# Patient Record
Sex: Female | Born: 1955 | Race: White | Hispanic: No | Marital: Married | Smoking: Never smoker
Health system: Southern US, Community
[De-identification: ages and names within clinical notes are randomized; demographics above are authoritative.]

## PROBLEM LIST (undated history)

## (undated) DIAGNOSIS — K219 Gastro-esophageal reflux disease without esophagitis: Secondary | ICD-10-CM

## (undated) DIAGNOSIS — K222 Esophageal obstruction: Secondary | ICD-10-CM

## (undated) DIAGNOSIS — J45909 Unspecified asthma, uncomplicated: Secondary | ICD-10-CM

## (undated) HISTORY — PX: ABDOMINAL HYSTERECTOMY: SHX81

## (undated) HISTORY — PX: ESOPHAGEAL DILATION: SHX303

---

## 1998-07-31 ENCOUNTER — Ambulatory Visit (HOSPITAL_COMMUNITY): Admission: RE | Admit: 1998-07-31 | Discharge: 1998-07-31 | Payer: Self-pay | Admitting: Obstetrics and Gynecology

## 1998-12-12 ENCOUNTER — Encounter: Payer: Self-pay | Admitting: Gastroenterology

## 1998-12-12 ENCOUNTER — Encounter (INDEPENDENT_AMBULATORY_CARE_PROVIDER_SITE_OTHER): Payer: Self-pay | Admitting: Specialist

## 1998-12-12 ENCOUNTER — Ambulatory Visit (HOSPITAL_COMMUNITY): Admission: RE | Admit: 1998-12-12 | Discharge: 1998-12-12 | Payer: Self-pay | Admitting: Gastroenterology

## 2002-09-15 ENCOUNTER — Other Ambulatory Visit: Admission: RE | Admit: 2002-09-15 | Discharge: 2002-09-15 | Payer: Self-pay | Admitting: Obstetrics and Gynecology

## 2003-03-23 ENCOUNTER — Encounter: Admission: RE | Admit: 2003-03-23 | Discharge: 2003-03-23 | Payer: Self-pay | Admitting: Obstetrics and Gynecology

## 2003-06-06 ENCOUNTER — Encounter (INDEPENDENT_AMBULATORY_CARE_PROVIDER_SITE_OTHER): Payer: Self-pay | Admitting: Specialist

## 2003-06-07 ENCOUNTER — Inpatient Hospital Stay (HOSPITAL_COMMUNITY): Admission: RE | Admit: 2003-06-07 | Discharge: 2003-06-08 | Payer: Self-pay | Admitting: Obstetrics and Gynecology

## 2003-11-01 ENCOUNTER — Other Ambulatory Visit: Admission: RE | Admit: 2003-11-01 | Discharge: 2003-11-01 | Payer: Self-pay | Admitting: Obstetrics and Gynecology

## 2004-03-04 ENCOUNTER — Emergency Department (HOSPITAL_COMMUNITY): Admission: EM | Admit: 2004-03-04 | Discharge: 2004-03-05 | Payer: Self-pay | Admitting: Emergency Medicine

## 2006-02-23 ENCOUNTER — Emergency Department (HOSPITAL_COMMUNITY): Admission: EM | Admit: 2006-02-23 | Discharge: 2006-02-24 | Payer: Self-pay | Admitting: Emergency Medicine

## 2007-02-26 ENCOUNTER — Encounter: Payer: Self-pay | Admitting: Internal Medicine

## 2007-02-26 ENCOUNTER — Ambulatory Visit (HOSPITAL_COMMUNITY): Admission: RE | Admit: 2007-02-26 | Discharge: 2007-02-26 | Payer: Self-pay | Admitting: Family Medicine

## 2007-05-06 HISTORY — PX: REPLACEMENT TOTAL KNEE: SUR1224

## 2007-05-26 ENCOUNTER — Inpatient Hospital Stay (HOSPITAL_COMMUNITY): Admission: RE | Admit: 2007-05-26 | Discharge: 2007-05-30 | Payer: Self-pay | Admitting: Orthopedic Surgery

## 2009-05-08 ENCOUNTER — Ambulatory Visit: Payer: Self-pay | Admitting: Internal Medicine

## 2009-05-08 DIAGNOSIS — J45909 Unspecified asthma, uncomplicated: Secondary | ICD-10-CM | POA: Insufficient documentation

## 2010-05-25 ENCOUNTER — Encounter: Payer: Self-pay | Admitting: Obstetrics and Gynecology

## 2010-05-25 ENCOUNTER — Encounter: Payer: Self-pay | Admitting: Interventional Radiology

## 2010-06-06 NOTE — Assessment & Plan Note (Signed)
Summary: ALLERGIES/KLW   Primary Provider/Referring Provider:  [name]  CC:  Allergy consult-Dr. Morrell Riddle.Marland Kitchen  History of Present Illness: May 08, 2009- 55yo F seen on kind referral by Mary Lennox PA-C at [facility], concerned about wheezing dyspnea. She has never smoked, and denies significant repiratoryproblem before a pneumonia when she was hosp at [facility] 3 years ago. She never full regained her premorbid breathing status after that. PFT 02/26/07- showed reduced FEV1/FVC with normal ratiio 0.80 and marked improvement after dilator. Since then she has had episodic shortness of breath with wheeze and soreness through sternum to back. These episodes respond to albuterol and prednisone and she si well in between. Trriggers include cigarette smoke, strong smells, some perfumes, leaf raking, change of seasons. She says she has few obvious colds. Now using Symbicort with intention to change to Qvar, but she is concerned that her insurance will only cover generics. She is breathing easily at night, crediting symbicort. Prilosec minimizes GERD symptoms, but she is pending upper endoscopy with Dr Mary Daniels. She hasn't noted seasonal rhinitis, itching or sneezing. Eyes do water in Spring.  Preventive Screening-Counseling & Management  Alcohol-Tobacco     Smoking Status: never  Current Medications (verified): 1)  Symbicort 160-4.5 Mcg/act Aero (Budesonide-Formoterol Fumarate) .... 2 Puffs Two Times A Day 2)  Proair Hfa 108 (90 Base) Mcg/act Aers (Albuterol Sulfate) .... 2 Puffs Four Times A Day As Needed 3)  Zyrtec Hives Relief 10 Mg Tabs (Cetirizine Hcl) .... Take 1 By Mouth Once Daily 4)  Womens Multivitamin Plus  Tabs (Multiple Vitamins-Minerals) .... Take 1 By Mouth Once Daily 5)  Prilosec Otc 20 Mg Tbec (Omeprazole Magnesium) .... 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  ETOH-never No Street Drug Use.   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  ETOH-never No Street Drug Use. Smoking Status:  never  Review of Systems      See HPI       The patient complains of shortness of breath with activity, shortness of breath at rest, chest pain, acid heartburn, indigestion, difficulty swallowing, sore throat, nasal congestion/difficulty breathing through nose, sneezing, itching, ear ache, and change in color of mucus.  The patient denies productive cough, non-productive cough, coughing up blood, irregular heartbeats, loss of appetite, weight change, abdominal pain, tooth/dental problems, headaches, anxiety, depression, hand/feet swelling, joint stiffness or pain, rash, and fever.    Vital Signs:  Patient profile:   55 year old female Height:      63 inches Weight:      246.50 pounds BMI:     43.82 O2 Sat:      98 % on Room air Pulse rate:   94 / minute BP sitting:   124 / 80  (left arm) Cuff size:   large  Vitals Entered By: Reynaldo Minium CMA (May 08, 2009 1:36 PM)  O2 Flow:  Room air  Physical Exam  Additional Exam:  General: A/Ox3; pleasant and cooperative, NAD, overweight SKIN: no rash, lesions NODES: no lymphadenopathy HEENT: [place]/AT, EOM- WNL, Conjuctivae- clear, PERRLA, TM-WNL, Nose- clear, Throat- clear  and wnl NECK: Supple w/ fair ROM, JVD- none, normal carotid impulses w/o bruits Thyroid- normal to palpation CHEST: trace expiratory squeeks, no wheeze or rhonchi,  unlabored HEART: RRR, no m/g/r heard ABDOMEN: Soft and nl; nml bowel sounds; ZOX:WRUE, nl pulses, no edema  NEURO: Grossly intact to observation      Impression & Recommendations:  Problem # 1:  ASTHMA (ICD-493.90) Adult onset asthma. We will get updated PFT to compare with 2008 from [facility], and get CXR. As she finishes the Symbicort she will transition to Qvar as planned. We can consider later whether more specific allergy evaluation would be useful.  Medications Added to Medication List This Visit: 1)  Symbicort 160-4.5 Mcg/act Aero (Budesonide-formoterol fumarate) .... 2 puffs two times a day 2)  Proair Hfa 108 (90 Base) Mcg/act Aers (Albuterol sulfate) .... 2 puffs four times a day as needed 3)  Zyrtec Hives Relief 10 Mg Tabs (Cetirizine hcl) .... Take 1 by mouth once daily 4)  Womens Multivitamin Plus Tabs (Multiple vitamins-minerals) .... Take 1 by mouth once daily 5)  Prilosec Otc 20 Mg Tbec (Omeprazole magnesium) .... Take 1 by mouth once daily  Other Orders: Consultation Level IV (99244) T-2 View CXR (71020TC)  Patient Instructions: 1)  Please schedule a follow-up appointment in 1 month. 2)  Schedule PFT 3)  A chest x-ray has been recommended.  Your imaging study may require preauthorization.

## 2010-09-17 NOTE — H&P (Signed)
NAMEINELL, MIMBS NO.:  [id number]   MEDICAL RECORD NO.:  [id number]          PATIENT TYPE:  INP   LOCATION:  NA                           FACILITY:  [facility]   PHYSICIAN:  Ollen Gross, M.D.    DATE OF BIRTH:  12/28/1955   DATE OF ADMISSION:  DATE OF DISCHARGE:                              HISTORY & PHYSICAL   Date of office visit and history and physical was performed on May 13, 2007.   CHIEF COMPLAINT:  Left knee pain.   HISTORY OF PRESENT ILLNESS:  The patient is a 55 year old female who has  seen by Dr. Lequita Halt for bilateral knee pain.  The left is more  symptomatic and problematic than the right.  It has been ongoing for  quite some time now.  It has gotten worse over the past several years.  She is seen in the office, where she has essentially bone on bone in the  medial compartment.  It has progressively gotten worse over time.  It  has been refractory to nonoperative management and now felt to be a good  candidate.  She has been seen preoperatively by Dr. Holley Bouche and  felt to be cleared for surgery.   ALLERGIES:  No known drug allergies.   CURRENT MEDICATIONS:  One A Day Vitamin for Women, Prilosec, Aleve,  calcium, glucosamine/chondroitin with MSN.   PAST MEDICAL HISTORY:  1. Past history of bronchitis.  2. Past history of pneumonia.  3. COPD.  4. Hiatal hernia.  5. Reflux disease.  6. History of colonic polyps.   PAST SURGICAL HISTORY:  1. Hysterectomy.  2. EGD with esophageal dilatation approximately 5 or 6 years ago.  3. Colonoscopy with polypectomy (she has had a total of three EGD      procedures).   FAMILY HISTORY:  Noncontributory.   SOCIAL HISTORY:  Married, nonsmoker.  No alcohol.  Three children.  Family will be assisting with care after surgery.   REVIEW OF SYSTEMS:  GENERAL:  No fevers, chills, night sweats.  NEUROLOGIC:  No seizure, syncope or paralysis.  RESPIRATORY:  No  shortness of breath, productive cough  or hemoptysis.  CARDIOVASCULAR:  No chest pain, angina or orthopnea.  GI:  No nausea, vomiting, diarrhea  or constipation.  GU:  No dysuria, dysuria or discharge.  MUSCULOSKELETAL:  Joint pain and swelling, morning stiffness with the  left knee.   PHYSICAL EXAM:  VITAL SIGNS:  Pulse 76, respirations 14, blood pressure  138/78.  GENERAL:  A 55 year old white female, well-nourished, well-developed,  overweight, in no acute distress, accompanied by her husband.  HEENT:  Normocephalic, atraumatic.  Pupils equal, round and reactive.  EOMs intact.  NECK:  Supple.  CHEST:  Clear, anterior posterior chest walls.  HEART:  Regular rate and rhythm without murmur, S1-S2 noted.  ABDOMEN:  Soft, nontender.  Bowel sounds present.  RECTAL, BREASTS, GENITALIA:  Not done, not pertinent to present illness.  EXTREMITIES:  Left knee:  Range of motion 5-110, tender more medial than  lateral.  No instability.   IMPRESSION:  1. Osteoarthritis, left knee.  2.  Past history of bronchitis.  3. Past history of pneumonia.  4. Chronic obstructive pulmonary disease.  5. Hiatal hernia.  6. Reflux disease.  7. History of colon polyps, status post polypectomy.   PLAN:  The patient admitted to [facility] to undergo a left  total knee replacement arthroplasty.  Surgery will be performed by Dr.  Ollen Gross.      Alexzandrew L. Perkins, P.A.C.      Ollen Gross, M.D.  Electronically Signed    ALP/MEDQ  D:  05/25/2007  T:  05/26/2007  Job:  [id number]   cc:   Holley Bouche, M.D.  Fax: [phone]   Bernette Redbird, M.D.  Fax: [phone]

## 2010-09-17 NOTE — Op Note (Signed)
Mary Daniels, Mary Daniels              ACCOUNT NO.:  [id number]   MEDICAL RECORD NO.:  [id number]          PATIENT TYPE:  INP   LOCATION:  1620                         FACILITY:  [facility]   PHYSICIAN:  Ollen Gross, M.D.    DATE OF BIRTH:  April 22, 1956   DATE OF PROCEDURE:  05/26/2007  DATE OF DISCHARGE:                               OPERATIVE REPORT   PREOPERATIVE DIAGNOSIS:  Osteoarthritis, left knee.   POSTOPERATIVE DIAGNOSIS:  Osteoarthritis, left knee.   PROCEDURE:  Left total knee arthroplasty.   SURGEON:  Ollen Gross, M.D.   ASSISTANT:  Alexzandrew L. Perkins, P.A.C.   ANESTHESIA:  Attempted spinal then general.   ESTIMATED BLOOD LOSS:  About 200 mL.   DRAINS:  None.   TOURNIQUET TIME:  No tourniquet used.   COMPLICATIONS:  None.   CONDITION:  Stable to recovery.   BRIEF CLINICAL NOTE:  Mary Daniels is a 55 year old female with end stage  arthritis of the left knee with progressively worsening pain and  dysfunction.  She has failed nonoperative management and presents for  total knee arthroplasty.   PROCEDURE IN DETAIL:  After the attempted administration of a spinal  anesthetic, a tourniquet was placed high on the left thigh and the left  lower extremity prepped and draped in the usual sterile fashion.  The  extremity was wrapped in Esmarch, knee flexed, and tourniquet inflated  300 mmHg.  A midline incision was made and it was obvious that the  patient was not tolerating this.  She was then converted over to general  anesthetic.  The incision is deepened to the extensor mechanism and a  fresh blade then used to make a medial parapatellar arthrotomy.  Soft  tissue over the proximal medial tibia is subperiosteally elevated to the  joint line with the knife into the semimembranosus bursa with a Cobb  elevator.  At this point, it was evident that this is a venous  tourniquet and it is not functioning well.  We subsequently released the  tourniquet and did the procedure  without tourniquet.  The soft tissue  over the proximal lateral tibia is elevated with attention being paid to  avoid patellar tendon on tibial tubercle.  The patella was subluxed  laterally, knee flexed 90 degrees, ACL and PCL removed.  A drill was  used to create a starting hole in the distal femur and the canal was  thoroughly irrigated.  The 5 degrees left valgus alignment guide was  placed and referencing off the posterior condyles, the rotation is  marked and the block pinned to remove 11 mm off the distal femur.  I  took 11 because of a preop flexion contracture.  Distal femoral  resection is made with an oscillating saw.  Size 3 is the most  appropriate femoral component and rotation is marked off the epicondylar  axis.  Size 3 cutting block is placed and the anterior, posterior, and  chamfer cuts made.   The tibia is subluxed forward and the menisci are removed.  Extramedullary tibial alignment guide is placed referencing proximally  on the medial aspect of the tibial  tubercle and distally on the second  metatarsal axis and tibial crest.  The block is pinned to remove  approximately 10 mm off of the non-deficient lateral side.  Tibial  resection is made with an oscillating saw.  Size 3 is the most  appropriate tibial component and the proximal tibia is prepared with the  modular drill and keel punch for a size 3.  Femoral preparation is  completed with the intercondylar cut.   A size 3 mobile bearing tibial trial, size 3 posterior stabilized  femoral trial, and a 10 mm posterior stabilized rotating platform insert  trial are placed.  With a 10, she has got a little bit of AP laxity in  extension and flexion.  We went to a 12.5 which allowed for full  extension with excellent varus, valgus, anterior, and posterior balance  throughout full range of motion.  The patella was then everted and  thickness measured to be about 23 mm.  Freehand resection is taken to 14  mm, 35 template  is placed, lug holes were drilled, trial patella was  placed and it tracks normally.  Osteophytes are removed off the  posterior femur with the trial in place.  All trials are removed and the  cut bone surfaces prepared with pulsatile lavage.  The cement was mixed  and once ready for implantation, a size 3 mobile bearing tibial tray,  size 3 posterior stabilized femur, and 35 patella are cemented into  place.  The patella was held with the clamp.  The trial 12.5 insert was  placed, knee held in full extension, all extruded cement removed.  When  the cement was fully hardened, we copiously irrigated the joint with  saline solution.  The trials were removed and FloSeal injected on the  posterior capsule.  The permanent 12.5 mm posterior stabilized rotating  platform insert is placed into the tibial tray.  The remainder of the  FloSeal is injected in the medial and lateral gutters and suprapatellar  area.  A moist sponge is placed.  We held the sponge for two minutes and  then removed it.  Any identified bleeding is stopped with  electrocautery.  We then thoroughly irrigated the joint and closed the  extensor mechanism with interrupted #1 PDS.  Flexion against gravity was  about 130 degrees.  The subcu was closed with interrupted 2-0 Vicryl and  subcuticular running 4-0 Monocryl.  The incision was cleaned and dried  and Steri-Strips and a bulky sterile dressing applied.  She is placed  into a knee immobilizer, awakened, and transferred to recovery in stable  condition.      Ollen Gross, M.D.  Electronically Signed     FA/MEDQ  D:  05/26/2007  T:  05/26/2007  Job:  [id number]

## 2010-09-20 NOTE — Op Note (Signed)
NAME:  Mary Daniels, Mary Daniels                        ACCOUNT NO.:  [id number]   MEDICAL RECORD NO.:  [id number]                   PATIENT TYPE:  OBV   LOCATION:  9311                                 FACILITY:  [facility]   PHYSICIAN:  Juluis Mire, M.D.                DATE OF BIRTH:  16-Jun-1955   DATE OF PROCEDURE:  06/06/2003  DATE OF DISCHARGE:                                 OPERATIVE REPORT   PREOPERATIVE DIAGNOSIS:  Uterine fibroid.   POSTOPERATIVE DIAGNOSIS:  Uterine fibroid.   OPERATION:  Laparoscopically assisted vaginal hysterectomy.   SURGEON:  Juluis Mire, M.D.   ASSISTANTFreddy Finner, M.D.   ANESTHESIA:  General endotracheal anesthesia.   ESTIMATED BLOOD LOSS:  400 mL.   PACKS AND DRAINS:  None.   INTRAOPERATIVE BLOOD PLACED:  None.   COMPLICATIONS:  None.   INDICATIONS FOR PROCEDURE:  As dictated in history and physical.   DESCRIPTION OF PROCEDURE:  The patient was taken to the OR and placed in the  supine position.  After satisfactory level of general endotracheal  anesthesia obtained, the patient was placed in the dorsal lithotomy position  using the Allen stirrups.  The abdomen, perineum and vagina were prepped out  with Betadine.  Bladder was emptied by in and out catheterization.  A Hulka  tenaculum was put in place.  The patient was draped as sterile field.  Subumbilical incision made with a knife.  The Veress needle was introduced  in the abdominal cavity.  Abdomen was inflated to approximately 3 liters of  carbon dioxide.  Due to the patient's obesity, we had trouble inserting the  trocar.  We kept ending up preperitoneal.  Instead of going to a longer  trocar, we decided to go to open procedure.  The fascia was identified and  entered sharply.  Incision in the fascia extended laterally.  The peritoneum  was entered.  Open laparoscopic trocar was put in place and secured.  Laparoscope was then introduced.  There was no evidence of injury to  adjacent  organs.  A 5 mm trocar was put in place in the suprapubic area  under direct visualization.  Visualization revealed the uterus to have a  large fibroid on the left fundus along the lower right uterine segment.  Tubes and ovaries were unremarkable.  She did have some implants of  endometriosis on the bladder flap.  Appendix was retrocecal, unremarkable.  Upper abdomen including the liver, tip of the gallbladder were clear.  Using  the Gyrus bipolar, first the right utero-ovarian pedicle was cauterized and  incised, right tube and mesosalpinx were cauterized and incised, right round  ligament was cauterized and incised.  At this point in time, the left utero-  ovarian pedicle was cauterized and incised, left tube and mesosalpinx were  cauterized and incised, and left round ligament was cauterized and incised.  We had good separation and hemostasis.  Abdomen was deflated of its carbon dioxide.  Laparoscope was removed.  The  patient's legs were repositioned.  Hulka tenaculum was then removed.  A  weighted speculum was placed in the vaginal vault.  Cervix was grasped with  Jacob's tenaculum.  Cul-de-sac was entered sharply.  Both uterosacral  ligaments were clamped, cut, and suture ligated with 0 Vicryl.  The  reflection of the vaginal mucosa anterior was incised and bladder was  dissected superiorly.  Using the clamp, cut and tie technique with suture  ligatures of 0 Vicryl, peritoneum was serially separated from the sides of  the uterus.  The small fibroid on the right side was inhibiting  visualization.  Therefore, it was removed.  At this point in time, we had  trouble entering the vesicouterine space.  The uterus was clipped.  We  identified the vesicouterine space and entered it sharply at this point in  time.  The remaining pedicles were clamped and cut and the uterus was passed  off of the operative field.  Held pedicle secure with free tie of 0 Vicryl.  Areas of oozing brought under  control with figure-of-eight of 0 Vicryl.  At  this point in time, vaginal mucosa was reapproximated in the midline with  interrupted sutures of 0 Vicryl.  Sponge on sponge stick was placed in the  vaginal vault.  Foley was placed to straight drain with retrieval of an  adequate amount of clear urine.   The patient's legs were repositioned.  Weighted speculum had been removed.  Laparoscope was reintroduced.  Visualization revealed some minimal oozing  from the vaginal cuff, brought under control with the Gyrus.  We thoroughly  irrigated the pelvis.  We had good hemostasis with no evidence of injury to  adjacent organs and clear urine output.  The abdomen was desufflated of its  carbon dioxide.  All trocars removed.  Subumbilical fascia closed with  figure-of-eight of 0 Vicryl, skin with interrupted subcuticulars of 4-0  Vicryl, suprapubic incision was closed with Dermabond.  Sponge on sponge  stick was removed.  The patient was taken out of the dorsal lithotomy  position.  Once extubated and alert, transferred to the recovery room in  good condition.  Sponge, needle and instrument counts were reported as  correct by the circulating nurse x3.                                               Juluis Mire, M.D.    JSM/MEDQ  D:  06/06/2003  T:  06/06/2003  Job:  [id number]

## 2010-09-20 NOTE — Discharge Summary (Signed)
Mary Daniels, Mary Daniels              ACCOUNT NO.:  [id number]   MEDICAL RECORD NO.:  [id number]          PATIENT TYPE:  INP   LOCATION:  1620                         FACILITY:  [facility]   PHYSICIAN:  Ollen Gross, M.D.    DATE OF BIRTH:  [birth year]   DATE OF ADMISSION:  05/26/2007  DATE OF DISCHARGE:  05/30/2007                               DISCHARGE SUMMARY   ADMITTING DIAGNOSES:  1. Osteoarthritis, left knee.  2. Past history of bronchitis.  3. Past history of pneumonia.  4. Chronic obstructive pulmonary disease.  5. Hiatal hernia.  6. Reflux disease.  7. History of colon polyps, status post polypectomy.   DISCHARGE DIAGNOSES:  1. Osteoarthritis, left knee, status post left total knee      arthroplasty.  2. Mild postoperative blood loss anemia.  3. Past history of pneumonia.  4. Chronic obstructive pulmonary disease.  5. Hiatal hernia.  6. Reflux disease.  7. History of colon polyps, status post polypectomy.   PROCEDURE:  May 26, 2007:  Left total knee surgery.   SURGEON:  Ollen Gross, M.D.   ASSISTANT:  Alexzandrew L. Perkins, P.A.C.   ANESTHESIA:  Attempted spinal, then conversion to general.   CONSULTS:  None.   BRIEF HISTORY:  Mary Daniels is a 55 year old female with end-stage  osteoarthritis of the left knee, progressive worsening pain,  dysfunction, failed non-operative interventions and who now presents for  total knee arthroplasty.   LABORATORY DATA:  Preop CBC showed a hemoglobin of 14.2, hematocrit of  40.5, white cell count 9.8.  Postop hemoglobin 10.7, drifting down to  9.9.  H&H 9.3 and 26.3.  PT and PTT on admission 12.3, and 27  respectively.  INR 0.9; serial protimes were followed with last PT/INR  22.9 and 2.  Chem panel on admission all within normal limits.  Serial  BMETs were followed and electrolytes remained within normal limits.  C02  went up from 28 to 34.  Preop UA:  Small leukocyte esterase, a few  epithelials, 0-2 white cells, a few  bacteria.  Blood type 0 negative.   EKG dated May 24, 2007 normal sinus rhythm, normal EKG, unconfirmed.   X-RAYS:  Chest x-ray May 24, 2007 mild bronchiectatic changes.   HOSPITAL COURSE:  The patient was admitted to [facility] and  tolerated procedure well.  She was later transferred to the recovery  room and orthopedic floor.  She had a fair amount of itching on the  evening of surgery and on the morning of day #1 felt to be due to the  Duramorph.  Used Benadryl and p.r.n. meds.  She had a history of  sciatica and her back was a little sore on the morning of day 1.  We  encouraged her to get up out of bed with therapy.  Home meds were  resumed.  Hemoglobin was stable at 10.7.  By day 2, still was a little  painful.  Her sciatica had been acting up.  Encouraged p.o. meds.  Discontinued the PCA.  She actually did get up, went well with therapy,  walked about 200 feet.  Dressing change.  Incision looked good.  No  signs of infection.  She was kept an extra day due to the pain.  Stayed  2 more days but by day 4, she was doing well.  Continued to work with  therapy.  Pain was under better control, and by May 30, 2007 she was  discharged to home.   DISCHARGE PLAN:  The patient was discharged to home on May 30, 2007.   See above for discharge diagnoses.   DISCHARGE MEDICATIONS:  Percocet, Robaxin, Coumadin.   ACTIVITY:  Weightbearing as tolerated.  Total knee protocol.   DIET:  As tolerated.   FOLLOWUP:  In 2 weeks.   DISPOSITION:  Home.   CONDITION ON DISCHARGE:  Improving.      Alexzandrew L. Perkins, P.A.C.      Ollen Gross, M.D.  Electronically Signed    ALP/MEDQ  D:  07/13/2007  T:  07/13/2007  Job:  [id number]   cc:   Ollen Gross, M.D.  Fax: [phone]   Bernette Redbird, M.D.  Fax: [phone]   Holley Bouche, M.D.  Fax: [phone]

## 2010-09-20 NOTE — Discharge Summary (Signed)
NAME:  Mary Daniels, Mary Daniels                        ACCOUNT NO.:  [id number]   MEDICAL RECORD NO.:  [id number]                   PATIENT TYPE:  INP   LOCATION:  9311                                 FACILITY:  [facility]   PHYSICIAN:  Juluis Mire, M.D.                DATE OF BIRTH:  June 09, 1955   DATE OF ADMISSION:  06/06/2003  DATE OF DISCHARGE:                                 DISCHARGE SUMMARY   ADMITTING DIAGNOSIS:  Uterine fibroids.   DISCHARGE DIAGNOSIS:  Uterine fibroids.   OPERATIVE PROCEDURE:  Laparoscopy-assisted vaginal hysterectomy.   For complete History and Physical please see dictated note.   COURSE IN THE HOSPITAL:  The patient underwent the above-noted surgery.  The  uterus was enlarged with multiple fibroids.  Pathology is still pending.  Ovaries were left in place.   On postoperative day #1 she was doing relatively well.  She was having  increasing dizziness with ambulation making it difficult to get out of bed.  She also had nausea and was unable to tolerate much p.o. intake.  Her bowel  sounds were active.  We felt that this may be just postoperative nausea or  possible ileus.  We did discontinue her IV and watched her GI function  throughout the day.  The hope was we would be able to discharge her home  that afternoon.  However, still that afternoon she had limited p.o. intake  and felt uncomfortable going home due to the continued nausea and the  dizziness with ambulation.  We held off restarting her IV at that time, had  her rest through the evening, and the following morning her function had  improved greatly.  She was then ambulating without dizziness.  Her p.o.  intake was much better.  At that time she was afebrile with stable vital  signs.  Her abdomen was soft and nontender.  Her bowel sounds were active at  that point.  All incisions were clear.  She had no active vaginal bleeding.   In terms of complications, she did have some postoperative nausea which may  have been just anesthetics in origin.  I believe the dizziness was also  related.  She is discharged home in stable condition.   DISPOSITION:  1. She is to avoid heavy lifting, vaginal entrance, or driving a car.  2. Discharged home on Tylox as needed for pain.  3. She will watch for signs of infection, nausea and vomiting, increasing     abdominal pain, or active vaginal bleeding.  4. Reassess in the office in 1 week.                                               Juluis Mire, M.D.    JSM/MEDQ  D:  06/08/2003  T:  06/08/2003  Job:  [id number]

## 2010-09-20 NOTE — H&P (Signed)
NAME:  Mary Daniels, Mary Daniels                        ACCOUNT NO.:  [id number]   MEDICAL RECORD NO.:  [id number]                   PATIENT TYPE:  OBV   LOCATION:  9399                                 FACILITY:  [facility]   PHYSICIAN:  Juluis Mire, M.D.                DATE OF BIRTH:  15-Jun-1955   DATE OF ADMISSION:  06/06/2003  DATE OF DISCHARGE:                                HISTORY & PHYSICAL   REASON FOR ADMISSION:  The patient is a 55 year old gravida 4 para 3 abortus  1 who presents for laparoscopic assisted vaginal hysterectomy.   HISTORY:  In relation to the present admission the patient's cycles are  regular every 30 days.  Flow is heavy with the use of 48 pads per cycle.  She has clots associated with this.  She underwent previous evaluation by  Dr. Cherly Hensen in the past with finding of a uterine fibroid.  For this she  underwent hysteroscopy and biopsy in 2000 with finding of benign  endometrium.  We repeated a saline infusion here in the office which  revealed a 3-4 cm fibroid abutting the endometrium and the probable cause of  her menorrhagia.  She did have an anemia associated with this with  hemoglobin level of 9.3 on initial evaluation.  She was referred to a  radiologist for possible radiologic embolization, after discussion with the  radiologist she came back and decided to proceed with laparoscopic assisted  vaginal hysterectomy.  Again alternatives have been discussed including  radiologic embolization, medical management, possible myomectomy or the  above-noted surgery that she is scheduled for.   ALLERGIES:  No known drug allergies.   MEDICATIONS:  None.   PAST MEDICAL HISTORY:  Usual childhood diseases, no significant sequelae.   PAST SURGICAL HISTORY:  She had tracheostomy as a baby and she had the  previous noted hysteroscopy D&C.   OBSTETRICAL HISTORY:  She has had three vaginal deliveries and one  miscarriage.   FAMILY HISTORY:  Noncontributory.   SOCIAL  HISTORY:  No tobacco or alcohol use.   REVIEW OF SYSTEMS:  Noncontributory.   PHYSICAL EXAMINATION:  VITALS:  Patient is afebrile with stable vital signs.  HEENT:  Patient is normocephalic, pupils equal, round and reactive to light  and accommodation, extraocular movements are intact, sclerae and  conjunctivae are clear, oropharynx clear.  NECK:  Without thyromegaly.  BREASTS:  No discrete masses.  LUNGS:  Clear.  CARDIOVASCULAR SYSTEM:  Regular rhythm and rate without murmurs or gallops.  ABDOMEN:  Benign, no mass, organomegaly, or tenderness.  PELVIC:  Normal external genitalia, vaginal mucosa clear, cervix  unremarkable, uterus approximately 10 to 12 weeks in size, adnexa  unremarkable.  Rectovaginal exam is clear.   IMPRESSION:  Uterine fibroids with associated menorrhagia.   PLAN:  We are going to attempt laparoscopic assisted vaginal hysterectomy,  ovaries will be left in place per the patient's request.  The potential  risks of a malignant transformation have been discussed.  The limited  functional capacity of the ovaries have also been explained.  The risks of  surgery discussed including the risk of anesthetics.  The risk of infection.  The risk of hemorrhage that can necessitate transfusion.  Risk of injury to  adjacent organs requiring further exploratory surgery.  The risk of deep  venous thrombosis and pulmonary embolus.  Patient expressed understanding of  indications and risks.                                               Juluis Mire, M.D.    JSM/MEDQ  D:  06/06/2003  T:  06/06/2003  Job:  [id number]

## 2011-01-23 LAB — CBC
HCT: 30 — ABNORMAL LOW
MCHC: 35.1
MCV: 88
Platelets: 220
Platelets: 229
Platelets: 253
RBC: 3.16 — ABNORMAL LOW
RBC: 4.6
RDW: 12
RDW: 12
RDW: 12.3
WBC: 9.8

## 2011-01-23 LAB — COMPREHENSIVE METABOLIC PANEL
ALT: 19
AST: 17
Alkaline Phosphatase: 54
CO2: 28
Calcium: 9.4
Chloride: 105
GFR calc non Af Amer: >60
Glucose, Bld: 100 — ABNORMAL HIGH
Sodium: 139
Total Bilirubin: 0.6

## 2011-01-23 LAB — PROTIME-INR
INR: 1.4
INR: 1.6 — ABNORMAL HIGH
Prothrombin Time: 14.7
Prothrombin Time: 17.6 — ABNORMAL HIGH
Prothrombin Time: 19.1 — ABNORMAL HIGH
Prothrombin Time: 22.9 — ABNORMAL HIGH

## 2011-01-23 LAB — URINE MICROSCOPIC-ADD ON

## 2011-01-23 LAB — URINALYSIS, ROUTINE W REFLEX MICROSCOPIC
Bilirubin Urine: NEGATIVE
Nitrite: NEGATIVE
Specific Gravity, Urine: 1.018
Urobilinogen, UA: 0.2
pH: 6.5

## 2011-01-23 LAB — BASIC METABOLIC PANEL
BUN: 2 — ABNORMAL LOW
BUN: 7
Calcium: 8.2 — ABNORMAL LOW
Calcium: 8.2 — ABNORMAL LOW
Chloride: 98
Creatinine, Ser: 0.76
GFR calc non Af Amer: >60
Glucose, Bld: 119 — ABNORMAL HIGH
Sodium: 136

## 2011-01-23 LAB — TYPE AND SCREEN

## 2011-01-23 LAB — ABO/RH: ABO/RH(D): O NEG

## 2011-09-16 ENCOUNTER — Other Ambulatory Visit: Payer: Self-pay | Admitting: Family Medicine

## 2011-09-16 DIAGNOSIS — Z1231 Encounter for screening mammogram for malignant neoplasm of breast: Secondary | ICD-10-CM

## 2011-09-17 ENCOUNTER — Other Ambulatory Visit: Payer: Self-pay | Admitting: Pediatrics

## 2011-09-17 ENCOUNTER — Ambulatory Visit
Admission: RE | Admit: 2011-09-17 | Discharge: 2011-09-17 | Disposition: A | Discharge status: Home / Self Care | Payer: BC Managed Care – PPO | Source: Ambulatory Visit | Attending: Pediatrics | Admitting: Pediatrics

## 2011-10-01 ENCOUNTER — Ambulatory Visit
Admission: RE | Admit: 2011-10-01 | Discharge: 2011-10-01 | Disposition: A | Discharge status: Home / Self Care | Payer: BC Managed Care – PPO | Source: Ambulatory Visit | Attending: Family Medicine | Admitting: Family Medicine

## 2011-10-01 DIAGNOSIS — Z1231 Encounter for screening mammogram for malignant neoplasm of breast: Secondary | ICD-10-CM

## 2011-10-09 ENCOUNTER — Other Ambulatory Visit: Payer: Self-pay | Admitting: Family Medicine

## 2011-10-09 DIAGNOSIS — R928 Other abnormal and inconclusive findings on diagnostic imaging of breast: Secondary | ICD-10-CM

## 2011-10-16 ENCOUNTER — Ambulatory Visit
Admission: RE | Admit: 2011-10-16 | Discharge: 2011-10-16 | Disposition: A | Discharge status: Home / Self Care | Payer: BC Managed Care – PPO | Source: Ambulatory Visit | Attending: Family Medicine | Admitting: Family Medicine

## 2011-10-16 DIAGNOSIS — R928 Other abnormal and inconclusive findings on diagnostic imaging of breast: Secondary | ICD-10-CM

## 2012-02-23 ENCOUNTER — Emergency Department (HOSPITAL_COMMUNITY): Payer: BC Managed Care – PPO

## 2012-02-23 ENCOUNTER — Encounter (HOSPITAL_COMMUNITY): Payer: Self-pay

## 2012-02-23 ENCOUNTER — Other Ambulatory Visit: Payer: Self-pay

## 2012-02-23 ENCOUNTER — Inpatient Hospital Stay (HOSPITAL_COMMUNITY)
Admission: EM | Admit: 2012-02-23 | Discharge: 2012-02-28 | DRG: 493 | Disposition: A | Discharge status: Home / Self Care | Payer: BC Managed Care – PPO | Attending: Infectious Diseases | Admitting: Infectious Diseases

## 2012-02-23 DIAGNOSIS — R112 Nausea with vomiting, unspecified: Secondary | ICD-10-CM | POA: Diagnosis present

## 2012-02-23 DIAGNOSIS — E876 Hypokalemia: Secondary | ICD-10-CM | POA: Diagnosis present

## 2012-02-23 DIAGNOSIS — H60399 Other infective otitis externa, unspecified ear: Secondary | ICD-10-CM | POA: Diagnosis not present

## 2012-02-23 DIAGNOSIS — J329 Chronic sinusitis, unspecified: Secondary | ICD-10-CM | POA: Diagnosis not present

## 2012-02-23 DIAGNOSIS — Z96659 Presence of unspecified artificial knee joint: Secondary | ICD-10-CM

## 2012-02-23 DIAGNOSIS — R0902 Hypoxemia: Secondary | ICD-10-CM | POA: Diagnosis present

## 2012-02-23 DIAGNOSIS — J45909 Unspecified asthma, uncomplicated: Secondary | ICD-10-CM | POA: Diagnosis present

## 2012-02-23 DIAGNOSIS — K811 Chronic cholecystitis: Secondary | ICD-10-CM

## 2012-02-23 DIAGNOSIS — Z79899 Other long term (current) drug therapy: Secondary | ICD-10-CM

## 2012-02-23 DIAGNOSIS — K8010 Calculus of gallbladder with chronic cholecystitis without obstruction: Secondary | ICD-10-CM | POA: Diagnosis present

## 2012-02-23 DIAGNOSIS — N12 Tubulo-interstitial nephritis, not specified as acute or chronic: Secondary | ICD-10-CM | POA: Diagnosis not present

## 2012-02-23 DIAGNOSIS — Z90710 Acquired absence of both cervix and uterus: Secondary | ICD-10-CM

## 2012-02-23 DIAGNOSIS — K8020 Calculus of gallbladder without cholecystitis without obstruction: Secondary | ICD-10-CM | POA: Diagnosis present

## 2012-02-23 DIAGNOSIS — Z6841 Body Mass Index (BMI) 40.0 and over, adult: Secondary | ICD-10-CM

## 2012-02-23 DIAGNOSIS — K859 Acute pancreatitis without necrosis or infection, unspecified: Secondary | ICD-10-CM

## 2012-02-23 DIAGNOSIS — K219 Gastro-esophageal reflux disease without esophagitis: Secondary | ICD-10-CM | POA: Diagnosis present

## 2012-02-23 HISTORY — DX: Esophageal obstruction: K22.2

## 2012-02-23 HISTORY — DX: Unspecified asthma, uncomplicated: J45.909

## 2012-02-23 HISTORY — DX: Gastro-esophageal reflux disease without esophagitis: K21.9

## 2012-02-23 LAB — URINALYSIS, ROUTINE W REFLEX MICROSCOPIC
Bilirubin Urine: NEGATIVE
Hgb urine dipstick: NEGATIVE
Ketones, ur: NEGATIVE mg/dL
Specific Gravity, Urine: 1.018 (ref 1.005–1.030)
Urobilinogen, UA: 1 mg/dL (ref 0.0–1.0)

## 2012-02-23 LAB — POCT I-STAT, CHEM 8
Creatinine, Ser: 1 mg/dL (ref 0.50–1.10)
Glucose, Bld: 133 mg/dL — ABNORMAL HIGH (ref 70–99)
Hemoglobin: 14.6 g/dL (ref 12.0–15.0)
Potassium: 3.4 mEq/L — ABNORMAL LOW (ref 3.5–5.1)

## 2012-02-23 LAB — CBC WITH DIFFERENTIAL/PLATELET
Hemoglobin: 13.9 g/dL (ref 12.0–15.0)
Lymphocytes Relative: 19 % (ref 12–46)
Lymphs Abs: 3 10*3/uL (ref 0.7–4.0)
Monocytes Relative: 8 % (ref 3–12)
Neutrophils Relative %: 73 % (ref 43–77)
Platelets: 274 10*3/uL (ref 150–400)
RBC: 4.77 MIL/uL (ref 3.87–5.11)
WBC: 16 10*3/uL — ABNORMAL HIGH (ref 4.0–10.5)

## 2012-02-23 LAB — COMPREHENSIVE METABOLIC PANEL
ALT: 210 U/L — ABNORMAL HIGH (ref 0–35)
Alkaline Phosphatase: 98 U/L (ref 39–117)
CO2: 28 mEq/L (ref 19–32)
Chloride: 102 mEq/L (ref 96–112)
GFR calc Af Amer: >90 mL/min (ref >90)
GFR calc non Af Amer: >90 mL/min (ref >90)
Glucose, Bld: 137 mg/dL — ABNORMAL HIGH (ref 70–99)
Potassium: 3.4 mEq/L — ABNORMAL LOW (ref 3.5–5.1)
Sodium: 139 mEq/L (ref 135–145)
Total Bilirubin: 0.6 mg/dL (ref 0.3–1.2)

## 2012-02-23 LAB — MAGNESIUM: Magnesium: 1.9 mg/dL (ref 1.5–2.5)

## 2012-02-23 LAB — URINE MICROSCOPIC-ADD ON

## 2012-02-23 MED ORDER — HYDROMORPHONE HCL PF 1 MG/ML IJ SOLN
1.0000 mg | Freq: Once | INTRAMUSCULAR | Status: AC
  Administered 2012-02-23: 1 mg via INTRAVENOUS
  Filled 2012-02-23: qty 1

## 2012-02-23 MED ORDER — ONDANSETRON HCL 4 MG PO TABS: 4.0000 mg | ORAL_TABLET | Freq: Four times a day (QID) | ORAL | Status: DC | PRN

## 2012-02-23 MED ORDER — SODIUM CHLORIDE 0.9 % IV SOLN
1000.0000 mL | Freq: Once | INTRAVENOUS | Status: AC
  Administered 2012-02-23: 1000 mL via INTRAVENOUS

## 2012-02-23 MED ORDER — ALBUTEROL SULFATE HFA 108 (90 BASE) MCG/ACT IN AERS
2.0000 | INHALATION_SPRAY | Freq: Four times a day (QID) | RESPIRATORY_TRACT | Status: DC | PRN
  Filled 2012-02-23: qty 6.7

## 2012-02-23 MED ORDER — MORPHINE SULFATE 2 MG/ML IJ SOLN
1.0000 mg | INTRAMUSCULAR | Status: DC | PRN
Start: 2012-02-23 — End: 2012-02-24
  Administered 2012-02-23 – 2012-02-24 (×2): 1 mg via INTRAVENOUS
  Filled 2012-02-23 (×3): qty 1

## 2012-02-23 MED ORDER — SODIUM CHLORIDE 0.9 % IV SOLN
1000.0000 mL | INTRAVENOUS | Status: DC
  Administered 2012-02-24 – 2012-02-25 (×3): 1000 mL via INTRAVENOUS

## 2012-02-23 MED ORDER — MORPHINE SULFATE 2 MG/ML IJ SOLN
1.0000 mg | Freq: Once | INTRAMUSCULAR | Status: AC
  Administered 2012-02-23: 1 mg via INTRAVENOUS
  Filled 2012-02-23: qty 1

## 2012-02-23 MED ORDER — FENTANYL CITRATE 0.05 MG/ML IJ SOLN
50.0000 ug | Freq: Once | INTRAMUSCULAR | Status: AC
  Administered 2012-02-23: 50 ug via INTRAVENOUS
  Filled 2012-02-23: qty 2

## 2012-02-23 MED ORDER — PANTOPRAZOLE SODIUM 40 MG PO TBEC
40.0000 mg | DELAYED_RELEASE_TABLET | Freq: Every day | ORAL | Status: DC
  Administered 2012-02-23 – 2012-02-28 (×4): 40 mg via ORAL
  Filled 2012-02-23 (×4): qty 1

## 2012-02-23 MED ORDER — LORATADINE 10 MG PO TABS
10.0000 mg | ORAL_TABLET | Freq: Every day | ORAL | Status: DC
  Administered 2012-02-23 – 2012-02-28 (×4): 10 mg via ORAL
  Filled 2012-02-23 (×6): qty 1

## 2012-02-23 MED ORDER — ENOXAPARIN SODIUM 40 MG/0.4ML ~~LOC~~ SOLN
40.0000 mg | SUBCUTANEOUS | Status: DC
  Administered 2012-02-23 – 2012-02-26 (×4): 40 mg via SUBCUTANEOUS
  Filled 2012-02-23 (×7): qty 0.4

## 2012-02-23 MED ORDER — ONDANSETRON HCL 4 MG/2ML IJ SOLN
4.0000 mg | Freq: Once | INTRAMUSCULAR | Status: AC
  Administered 2012-02-23: 4 mg via INTRAVENOUS
  Filled 2012-02-23: qty 2

## 2012-02-23 MED ORDER — MORPHINE SULFATE 2 MG/ML IJ SOLN: 1.0000 mg | Freq: Once | INTRAMUSCULAR | Status: DC

## 2012-02-23 MED ORDER — ONDANSETRON HCL 4 MG/2ML IJ SOLN
4.0000 mg | Freq: Four times a day (QID) | INTRAMUSCULAR | Status: DC | PRN
  Administered 2012-02-23 – 2012-02-25 (×4): 4 mg via INTRAVENOUS
  Filled 2012-02-23 (×6): qty 2

## 2012-02-23 MED ORDER — IOHEXOL 300 MG/ML  SOLN
100.0000 mL | Freq: Once | INTRAMUSCULAR | Status: AC | PRN
  Administered 2012-02-23: 100 mL via INTRAVENOUS

## 2012-02-23 MED ORDER — SENNOSIDES-DOCUSATE SODIUM 8.6-50 MG PO TABS
1.0000 | ORAL_TABLET | Freq: Two times a day (BID) | ORAL | Status: DC
  Administered 2012-02-23 – 2012-02-28 (×8): 1 via ORAL
  Filled 2012-02-23 (×9): qty 1

## 2012-02-23 MED ORDER — KETOROLAC TROMETHAMINE 30 MG/ML IJ SOLN
30.0000 mg | Freq: Four times a day (QID) | INTRAMUSCULAR | Status: DC
  Administered 2012-02-23 – 2012-02-27 (×15): 30 mg via INTRAVENOUS
  Filled 2012-02-23 (×21): qty 1

## 2012-02-23 MED ORDER — ACETAMINOPHEN 500 MG PO TABS
1000.0000 mg | ORAL_TABLET | Freq: Four times a day (QID) | ORAL | Status: DC | PRN
  Administered 2012-02-23 – 2012-02-28 (×4): 1000 mg via ORAL
  Filled 2012-02-23 (×5): qty 2

## 2012-02-23 MED ORDER — MONTELUKAST SODIUM 10 MG PO TABS
10.0000 mg | ORAL_TABLET | Freq: Every day | ORAL | Status: DC
  Administered 2012-02-23 – 2012-02-28 (×4): 10 mg via ORAL
  Filled 2012-02-23 (×6): qty 1

## 2012-02-23 MED ORDER — SODIUM CHLORIDE 0.9 % IJ SOLN
3.0000 mL | Freq: Two times a day (BID) | INTRAMUSCULAR | Status: DC
  Administered 2012-02-24 – 2012-02-28 (×5): 3 mL via INTRAVENOUS

## 2012-02-23 NOTE — H&P (Signed)
Hospital Admission Note Date: 02/23/2012  Patient name: Mary Daniels Medical record number: [id number] Date of birth: 10-06-1955 Age: 56 y.o. Gender: female PCP: No primary provider on file.   Service:  Internal Medicine Teaching Service  Attending Physician:  Dr. Ninetta Lights   First Contact:  Dr. Earlene Plater   Pager:  [phone] Second Contact:  Dr. Anselm Jungling Pager: [phone]     After 5PM, weekends, and holidays: First Contact:              Pager: [phone] Second Contact:         Pager: [phone]   Chief Complaint:  Abdominal pain    History of Present Illness:  This is a 56 year old woman with asthma and GERD, presenting with mid-abdominal pain radiating to the flanks bilaterally.  Onset was the middle of the night last night.  The patient woke up with abdominal pain and vomiting, but was able to fall back asleep.  When she woke up this morning, the pain was intolerable, and she had her daughter bring her to the ED.  She has had six episodes of emesis this morning and an episode of diarrhea yesterday.  On review of systems, she denies dyspnea, chest pain, fever, chills, dysuria, hematuria, constipation, headache, dizziness, and weakness.  She had a similar bout of pain and vomiting a couple of months ago but that episode lasted on a couple of hours.      Review of Systems:   Constitutional: Negative.  Negative for fever, chills and malaise/fatigue.  HENT: Negative.  Negative for hearing loss.   Eyes: Negative.   Respiratory: Negative.   Cardiovascular: Negative.  Negative for chest pain.  Gastrointestinal: Positive for nausea, vomiting, abdominal pain and diarrhea. Negative for constipation, blood in stool and melena.  Genitourinary: Negative.  Negative for dysuria.  Musculoskeletal: Negative.  Negative for myalgias and joint pain.  Skin: Negative.  Negative for rash.  Neurological: Negative for dizziness and headaches.  Psychiatric/Behavioral: Negative for depression.      Medical  History: Past Medical History  Diagnosis Date  . Asthma in adult   . GERD (gastroesophageal reflux disease)   . Schatzki's ring     previous dilatation    Surgical History: Past Surgical History  Procedure Date  . Abdominal hysterectomy   . Replacement total knee 2009    left  . Esophageal dilation     Schatski's ring    Home Medications: Current Outpatient Rx  Name Route Sig Dispense Refill  . ALBUTEROL SULFATE HFA 108 (90 BASE) MCG/ACT IN AERS Inhalation Inhale 2 puffs into the lungs every 6 (six) hours as needed. For wheezing    . CETIRIZINE HCL 10 MG PO TABS Oral Take 10 mg by mouth daily.    Marland Kitchen MONTELUKAST SODIUM 10 MG PO TABS Oral Take 10 mg by mouth daily.    . ONE-A-DAY 50 PLUS PO TABS Oral Take 1 tablet by mouth daily.    Marland Kitchen OMEPRAZOLE 20 MG PO CPDR Oral Take 20 mg by mouth daily.      Allergies: Allergies as of 02/23/2012  . (No Known Allergies)    Family History: Family History  Problem Relation Age of Onset  . Rheum arthritis Mother   . Cancer Father     COLON    Social History: Social History  . Marital Status: Married   Social History Main Topics  . Smoking status: Never Smoker   . Smokeless tobacco: Never Used  . Alcohol Use: No  .  Drug Use: No    Physical exam: VITALS: Blood pressure 110/57, pulse 63, temperature 97.6 F (36.4 C), temperature source Oral, resp. rate 16, SpO2 95.00%. GENERAL:  Alert and oriented; in moderate distress from pain HEAD:  Atraumatic and normocephalic EYES:  Pupils are equal and reactive to light; sclera anicteric ENT:  Oropharynx is clear and mucous membranes moist LUNGS:  Clear to auscultation bilaterally; normal work of breathing HEART:  Regular rate and rhythm; normal S1 and S2; no murmur, rubs, gallops or clicks PULSES:  DP and PT pulses 2+ and equal bilaterally ABDOMEN:  Diffusely tender but especially in the epigastrium; absent bowel sounds; no rigidity or guarding EXTREMITIES:  No peripheral edema;  extremities warm and well perfused    Lab results: Basic Metabolic Panel: Basename 02/23/12 0946 02/23/12 0932  NA 142 139  K 3.4* 3.4*  CL 101 102  CO2 -- 28  GLUCOSE 133* 137*  BUN 16 15  CREATININE 1.00 0.78  CALCIUM -- 9.5  MG -- --  PHOS -- --   Liver Function Tests: [facility] 02/23/12 0932  AST 309*  ALT 210*  ALKPHOS 98  BILITOT 0.6  PROT 7.5  ALBUMIN 3.6   Basename 02/23/12 0932  LIPASE >3000*  AMYLASE --  LDH 474   CBC:  Basename 02/23/12 0946 02/23/12 0932  WBC -- 16.0*  NEUTROABS -- 11.6*  HGB 14.6 13.9  HCT 43.0 41.8  MCV -- 87.6  PLT -- 274   Urinalysis:  Basename 02/23/12 1109  COLORURINE YELLOW  LABSPEC 1.018  PHURINE 5.5  GLUCOSEU NEGATIVE  HGBUR NEGATIVE  BILIRUBINUR NEGATIVE  KETONESUR NEGATIVE  PROTEINUR NEGATIVE  UROBILINOGEN 1.0  NITRITE NEGATIVE  LEUKOCYTESUR SMALL*    Imaging results: US Abdomen Complete 02/23/2012  FINDINGS:  Gallbladder:  Granular shadowing echogenic debris is seen in the gallbladder.  Wall measures 4 mm.  No sonographic Murphy's sign.  Common bile duct:  Measures 5 mm, within normal limits.  Liver:  Diffusely increased in echogenicity.  IVC:  Appears normal.  Pancreas:  No focal abnormality seen.  Spleen:  Measures 6.8 cm, negative.  Right Kidney:  Measures 12.4 cm.  Parenchymal echogenicity is normal.  No hydronephrosis.  No focal lesions.  Left Kidney:  Measures 12.3 cm.  Parenchymal echogenicity is normal.  No hydronephrosis.  No focal lesions.  Abdominal aorta:  No aneurysm identified.   IMPRESSION:  1. Granular cholelithiasis with slight wall thickening.  Absent sonographic Murphy's sign.  Findings can be seen with chronic cholecystitis. 2.  Hepatic steatosis.  Ct Abdomen Pelvis W Contrast 02/23/2012  FINDINGS: Lung bases show dependent atelectasis and/or scarring. Heart size within normal limits.  No pericardial or pleural effusion.  Small hiatal hernia.  Liver, gallbladder, adrenal glands, kidneys and  spleen are unremarkable.  The pancreas is hazy in appearance with surrounding inflammatory stranding and fluid.  The gland is uniform in attenuation, without areas of necrosis or organized fluid.  Stomach and bowel are unremarkable.  No pathologically enlarged lymph nodes.  No free fluid.  No worrisome lytic or sclerotic lesions. IMPRESSION: Acute pancreatitis without complicating feature.   Dg Chest Port 1 View 02/23/2012 FINDINGS: Enlargement of cardiac silhouette. Slight pulmonary vascular congestion. Mediastinal contours normal. Bronchitic changes with right basilar atelectasis. Lungs otherwise clear. No pleural effusion or pneumothorax. Bones unremarkable.   IMPRESSION: Mild enlargement of cardiac silhouette with slight pulmonary vascular congestion. Bronchitic changes with right basilar atelectasis.     Other results: EKG Results:  02/23/2012 Rate:  55  PR:  160 QRS:  80 QTc:  405 EKG: there are no previous tracings available for comparison, sinus bradycardia.   Assessment and Plan:  1.   Acute pancreatitis:  The patient's history, exam, and radiographic findings are all consistent with acute pancreatitis.  Lipase is > 3000.  There may be a component of chronic cholecystitis and certainly cholelithiasis.  The cause of the pancreatitis is most likely biliary sludge given the ultrasound findings.  She has never had alcohol and her calcium is normal.  We will check a triglyceride level in the morning.  Her Ranson's criteria score is 4 (AST, LDH, age, WBC).  We will treat with analgesia and bowel rest. - ketorolac and morphine for analgesia - npo  2.   Chronic cholecystitis:  Granular cholelithiasis on ultrasound and mild wall thickening.  Once the pancreatis has resolved, surgery plans to remove the gall bladder.  This may be the cause of the pancreatitis.  3.   Asthma/seasonal allergies:  Stable. Will keep her on home medications of albuterol as needed, montelukast, and loratadine.  4.    Morbid obesity:  BMI 44.  5.   Hypokalemia:  Potassium is 3.4.  May be secondary to GI loss following episode of diarrhea yesterday.  Vomiting can lead to metabolic alkalosis, which can lead to cellular shifting of potassium and a relative hypokalemia.  We will continue to monitor this and replace if it worsens or fails to improve.  6.   Prophylaxis: - enoxaparin 40mg  Annabella daily for VTE prophylaxis - senna-docusate BID for bowel regimen    Signed by:  Dorthula Rue. Earlene Plater, MD PGY-I, Internal Medicine  02/23/2012, 3:31 PM

## 2012-02-23 NOTE — Progress Notes (Signed)
1830 Dr. Anselm Jungling made aware of patient arrival to floor.

## 2012-02-23 NOTE — ED Notes (Signed)
Patient transported to Ultrasound 

## 2012-02-23 NOTE — H&P (Signed)
Internal Medicine Teaching Service Attending Note Date: 02/23/2012  Patient name: Mary Daniels  Medical record number: [id number]  Date of birth: 09/10/1955   I have seen and evaluated [name] and discussed their care with the Residency Team.   56 yo F with hx of asthma, sciatzkie's ring and dilatations (~10 yr prior). Comes to ED with abd and flank pain for 24 hours. She has developed n/v with this as well. She has had no change in the color of her sclera, or urine or bowel movements. She has had no f/c. Over the last 24 h she has developed worsened cough (prod green sputum) and wheeze.  She is found in ED to have a lipase of >3000. She does not smoke or drink.     . fentaNYL  50 mcg Intravenous Once  . fentaNYL  50 mcg Intravenous Once  .  HYDROmorphone (DILAUDID) injection  1 mg Intravenous Once  .  morphine injection  1 mg Intravenous Once  . ondansetron (ZOFRAN) IV  4 mg Intravenous Once  . ondansetron (ZOFRAN) IV  4 mg Intravenous Once    Physical Exam: Blood pressure 110/57, pulse 63, temperature 97.6 F (36.4 C), temperature source Oral, resp. rate 16, SpO2 95.00%. General appearance: alert, cooperative, mild distress and morbidly obese Eyes: negative findings: conjunctivae and sclerae normal and pupils equal, round, reactive to light and accomodation Throat: normal findings: oropharynx pink & moist without lesions or evidence of thrush Neck: no adenopathy and supple, symmetrical, trachea midline Lungs: clear to auscultation bilaterally Heart: regular rate and rhythm Abdomen: normal findings: bowel sounds normal and abnormal findings:  hypoactive bowel sounds and moderate tenderness in the RUQ, in the left flank and in the right flank Extremities: edema None  Lab results: Results for orders placed during the hospital encounter of 02/23/12 (from the past 24 hour(s))  CBC WITH DIFFERENTIAL     Status: Abnormal   Collection Time   02/23/12  9:32 AM      Component  Value Range   WBC 16.0 (*) 4.0 - 10.5 K/uL   RBC 4.77  3.87 - 5.11 MIL/uL   Hemoglobin 13.9  12.0 - 15.0 g/dL   HCT 08.6  57.8 - 46.9 %   MCV 87.6  78.0 - 100.0 fL   MCH 29.1  26.0 - 34.0 pg   MCHC 33.3  30.0 - 36.0 g/dL   RDW 62.9  52.8 - 41.3 %   Platelets 274  150 - 400 K/uL   Neutrophils Relative 73  43 - 77 %   Neutro Abs 11.6 (*) 1.7 - 7.7 K/uL   Lymphocytes Relative 19  12 - 46 %   Lymphs Abs 3.0  0.7 - 4.0 K/uL   Monocytes Relative 8  3 - 12 %   Monocytes Absolute 1.2 (*) 0.1 - 1.0 K/uL   Eosinophils Relative 1  0 - 5 %   Eosinophils Absolute 0.1  0.0 - 0.7 K/uL   Basophils Relative 0  0 - 1 %   Basophils Absolute 0.0  0.0 - 0.1 K/uL  COMPREHENSIVE METABOLIC PANEL     Status: Abnormal   Collection Time   02/23/12  9:32 AM      Component Value Range   Sodium 139  135 - 145 mEq/L   Potassium 3.4 (*) 3.5 - 5.1 mEq/L   Chloride 102  96 - 112 mEq/L   CO2 28  19 - 32 mEq/L   Glucose, Bld 137 (*) 70 -  99 mg/dL   BUN 15  6 - 23 mg/dL   Creatinine, Ser 1.61  0.50 - 1.10 mg/dL   Calcium 9.5  8.4 - 09.6 mg/dL   Total Protein 7.5  6.0 - 8.3 g/dL   Albumin 3.6  3.5 - 5.2 g/dL   AST 045 (*) 0 - 37 U/L   ALT 210 (*) 0 - 35 U/L   Alkaline Phosphatase 98  39 - 117 U/L   Total Bilirubin 0.6  0.3 - 1.2 mg/dL   GFR calc non Af Amer >90  >90 mL/min   GFR calc Af Amer >90  >90 mL/min  LIPASE, BLOOD     Status: Abnormal   Collection Time   02/23/12  9:32 AM      Component Value Range   Lipase >3000 (*) 11 - 59 U/L  LACTATE DEHYDROGENASE     Status: Abnormal   Collection Time   02/23/12  9:32 AM      Component Value Range   LDH 474 (*) 94 - 250 U/L  POCT I-STAT TROPONIN I     Status: Normal   Collection Time   02/23/12  9:44 AM      Component Value Range   Troponin i, poc 0.00  0.00 - 0.08 ng/mL   Comment 3           POCT I-STAT, CHEM 8     Status: Abnormal   Collection Time   02/23/12  9:46 AM      Component Value Range   Sodium 142  135 - 145 mEq/L   Potassium 3.4 (*)  3.5 - 5.1 mEq/L   Chloride 101  96 - 112 mEq/L   BUN 16  6 - 23 mg/dL   Creatinine, Ser 4.09  0.50 - 1.10 mg/dL   Glucose, Bld 811 (*) 70 - 99 mg/dL   Calcium, Ion 9.14  7.82 - 1.23 mmol/L   TCO2 28  0 - 100 mmol/L   Hemoglobin 14.6  12.0 - 15.0 g/dL   HCT 95.6  21.3 - 08.6 %  LACTIC ACID, PLASMA     Status: Abnormal   Collection Time   02/23/12 10:40 AM      Component Value Range   Lactic Acid, Venous 3.0 (*) 0.5 - 2.2 mmol/L  URINALYSIS, ROUTINE W REFLEX MICROSCOPIC     Status: Abnormal   Collection Time   02/23/12 11:09 AM      Component Value Range   Color, Urine YELLOW  YELLOW   APPearance CLEAR  CLEAR   Specific Gravity, Urine 1.018  1.005 - 1.030   pH 5.5  5.0 - 8.0   Glucose, UA NEGATIVE  NEGATIVE mg/dL   Hgb urine dipstick NEGATIVE  NEGATIVE   Bilirubin Urine NEGATIVE  NEGATIVE   Ketones, ur NEGATIVE  NEGATIVE mg/dL   Protein, ur NEGATIVE  NEGATIVE mg/dL   Urobilinogen, UA 1.0  0.0 - 1.0 mg/dL   Nitrite NEGATIVE  NEGATIVE   Leukocytes, UA SMALL (*) NEGATIVE  URINE MICROSCOPIC-ADD ON     Status: Normal   Collection Time   02/23/12 11:09 AM      Component Value Range   Squamous Epithelial / LPF RARE  RARE   WBC, UA 0-2  <3 WBC/hpf   RBC / HPF 0-2  <3 RBC/hpf   Bacteria, UA RARE  RARE    Imaging results:  US Abdomen Complete  02/23/2012  *RADIOLOGY REPORT*  Clinical Data:  Abdominal and flank pain.  COMPLETE  ABDOMINAL ULTRASOUND  Comparison:  None.  Findings:  Gallbladder:  Granular shadowing echogenic debris is seen in the gallbladder.  Wall measures 4 mm.  No sonographic Murphy's sign.  Common bile duct:  Measures 5 mm, within normal limits.  Liver:  Diffusely increased in echogenicity.  IVC:  Appears normal.  Pancreas:  No focal abnormality seen.  Spleen:  Measures 6.8 cm, negative.  Right Kidney:  Measures 12.4 cm.  Parenchymal echogenicity is normal.  No hydronephrosis.  No focal lesions.  Left Kidney:  Measures 12.3 cm.  Parenchymal echogenicity is normal.  No  hydronephrosis.  No focal lesions.  Abdominal aorta:  No aneurysm identified.  IMPRESSION:  1. Granular cholelithiasis with slight wall thickening.  Absent sonographic Murphy's sign.  Findings can be seen with chronic cholecystitis. 2.  Hepatic steatosis.   Original Report Authenticated By: Reyes Ivan, M.D.    Ct Abdomen Pelvis W Contrast  02/23/2012  *RADIOLOGY REPORT*  Clinical Data: Abdominal pain and flank pain.  Nausea and vomiting.  CT ABDOMEN AND PELVIS WITH CONTRAST  Technique:  Multidetector CT imaging of the abdomen and pelvis was performed following the standard protocol during bolus administration of intravenous contrast.  Contrast: OMNIPAQUE IOHEXOL 300 MG/ML  SOLN  Comparison: Ultrasound abdomen 02/23/2012.  Findings: Lung bases show dependent atelectasis and/or scarring. Heart size within normal limits.  No pericardial or pleural effusion.  Small hiatal hernia.  Liver, gallbladder, adrenal glands, kidneys and spleen are unremarkable.  The pancreas is hazy in appearance with surrounding inflammatory stranding and fluid.  The gland is uniform in attenuation, without areas of necrosis or organized fluid.  Stomach and bowel are unremarkable.  No pathologically enlarged lymph nodes.  No free fluid.  No worrisome lytic or sclerotic lesions.  IMPRESSION: Acute pancreatitis without complicating feature.   Original Report Authenticated By: Reyes Ivan, M.D.    Dg Chest Port 1 View  02/23/2012  *RADIOLOGY REPORT*  Clinical Data: Epigastric pain radiating to back, nausea, vomiting  PORTABLE CHEST - 2 VIEW  Comparison: Portable exam 0959 hours compared to 09/16/2028  Findings: Enlargement of cardiac silhouette. Slight pulmonary vascular congestion. Mediastinal contours normal. Bronchitic changes with right basilar atelectasis. Lungs otherwise clear. No pleural effusion or pneumothorax. Bones unremarkable.  IMPRESSION: Mild enlargement of cardiac silhouette with slight pulmonary vascular  congestion. Bronchitic changes with right basilar atelectasis.   Original Report Authenticated By: Lollie Marrow, M.D.     Assessment and Plan: I agree with the formulated Assessment and Plan with the following changes:   Acute Pancreatitis Cholelithiasis, granular  Asthma  Start PPI Keep NPO except ice chips IV hydration Hold anbx for now Check HIV test Appreciate surgical f/u  Comment- plan is to "cool off" her pancreas in anticipation that she will have cholecystectomy after this improves. Her ranson score is 2 (WBC 16k and age >23). Certainly her gender and weight put her at risk. Will check her lipids as well. Need hx of NSAID use as well. Her CT does not mention acute cholecystitis, data for antibiotic use use of acute pancreatitis is not clear. Will watch for leukocytosis, fever.   Ginnie Smart, MD 10/21/20133:18 PM

## 2012-02-23 NOTE — ED Notes (Signed)
Gave old and new ECG to Dr. Silverio Lay after I completed. 10:03 am JG.

## 2012-02-23 NOTE — ED Provider Notes (Addendum)
History     CSN: [id number]  Arrival date & time 02/23/12  4098   First MD Initiated Contact with Patient 02/23/12 [phone]      Chief Complaint  Patient presents with  . Flank Pain    (Consider location/radiation/quality/duration/timing/severity/associated sxs/prior treatment) HPI Comments: Mary Daniels 56 y.o. female   The chief complaint is: Patient presents with:   Flank Pain    56 year old female presents to the emergency department with chief complaint of abdominal and back pain. Patient is a poor historian as is family. Husband states that she woke up in the middle of the night with abdominal pain, and vomiting. She was able to go back to sleep and was sleeping when he left.  Wife called him mid-morning stating that she was in excruciating abdominal pain. She called daughter, and stated that she was lying on the bathroom floor and couldn't get up. Family brought her to the emergency department. Patient is yelling loudly.  Patient states that she has pain in her belly and back. She is unable to localize pain. She states that everything hurts. She denies urinary symptoms. She has a surgical history of laparoscopic hysterectomy. Patient had one episode of diarrhea, Yesterday. She has vomited approximately 6 times this morning including dry heaves. She denies similar history. She denies association with food. Denies history of chronic constipation.Denies history of ETOH abuse. Denies DOE, SOB, chest tightness or pressure, radiation to left arm, jaw or back or diaphoresis.  Cardiac risk factors include Obesity. Denies fevers, chills, myalgias, arthralgias.  Denies dysuria, flank pain, suprapubic pain, frequency, urgency, or hematuria. Denies headaches, light headedness, weakness, visual disturbances.       The history is provided by the patient, a relative and the spouse. No language interpreter was used.    History reviewed. No pertinent past medical history.  Past Surgical  History  Procedure Date  . Abdominal hysterectomy     No family history on file.  History  Substance Use Topics  . Smoking status: Never Smoker   . Smokeless tobacco: Not on file  . Alcohol Use:     OB History    Grav Para Term Preterm Abortions TAB SAB Ect Mult Living                  Review of Systems  Constitutional: Negative for fever and chills.  HENT: Negative for trouble swallowing, neck pain and neck stiffness.   Eyes: Negative for visual disturbance.  Respiratory: Negative for chest tightness and shortness of breath.   Cardiovascular: Negative for chest pain and palpitations.  Gastrointestinal: Positive for nausea, vomiting, abdominal pain and diarrhea. Negative for constipation, blood in stool and abdominal distention.  Genitourinary: Positive for flank pain. Negative for dysuria, frequency and hematuria.  Musculoskeletal: Positive for joint swelling and gait problem. Negative for myalgias and arthralgias.  Skin: Negative for rash.  Neurological: Negative for syncope, facial asymmetry, speech difficulty, weakness and numbness.  All other systems reviewed and are negative.    Allergies  Review of patient's allergies indicates no known allergies.  Home Medications   Current Outpatient Rx  Name Route Sig Dispense Refill  . ALBUTEROL SULFATE HFA 108 (90 BASE) MCG/ACT IN AERS Inhalation Inhale 2 puffs into the lungs every 6 (six) hours as needed. For wheezing    . CETIRIZINE HCL 10 MG PO TABS Oral Take 10 mg by mouth daily.    Marland Kitchen MONTELUKAST SODIUM 10 MG PO TABS Oral Take 10 mg by mouth daily.    Marland Kitchen  ONE-A-DAY 50 PLUS PO TABS Oral Take 1 tablet by mouth daily.    Marland Kitchen OMEPRAZOLE 20 MG PO CPDR Oral Take 20 mg by mouth daily.      BP 93/48  Pulse 62  Temp 97.6 F (36.4 C) (Oral)  Resp 16  SpO2 100%  Physical Exam  Vitals reviewed. Constitutional: She is oriented to person, place, and time. She appears well-developed and well-nourished. She appears distressed.        Morbidly obese, female. Appears to be in distress yelling loudly and crying  HENT:  Head: Normocephalic and atraumatic.  Eyes: Conjunctivae normal are normal. No scleral icterus.  Neck: Normal range of motion.  Cardiovascular: Normal rate, regular rhythm and normal heart sounds.  Exam reveals no gallop and no friction rub.   No murmur heard. Pulmonary/Chest: Effort normal and breath sounds normal. No respiratory distress.  Abdominal: Soft. Bowel sounds are normal. She exhibits no distension and no mass. There is no hepatosplenomegaly. There is tenderness (epigastric and right upper quadrant) in the right upper quadrant and epigastric area. There is no rigidity, no rebound, no guarding, no CVA tenderness and negative Murphy's sign.         Obese abdomen  Neurological: She is alert and oriented to person, place, and time.  Skin: Skin is warm and dry. She is not diaphoretic.    Date: 02/23/2012  Rate: 55  Rhythm: normal sinus rhythm  QRS Axis: normal  Intervals: normal  ST/T Wave abnormalities: normal  Conduction Disutrbances: none  Narrative Interpretation: Bradycardia- normal ekg  Old EKG Reviewed: No significant changes noted    ED Course  Procedures (including critical care time) CRITICAL CARE Performed by: Arthor Captain   Total critical care time: 60  Critical care time was exclusive of separately billable procedures and treating other patients.  Critical care was necessary to treat or prevent imminent or life-threatening deterioration.  Critical care was time spent personally by me on the following activities: development of treatment plan with patient and/or surrogate as well as nursing, discussions with consultants, evaluation of patient's response to treatment, examination of patient, obtaining history from patient or surrogate, ordering and performing treatments and interventions, ordering and review of laboratory studies, ordering and review of radiographic  studies, pulse oximetry and re-evaluation of patient's condition.  Labs Reviewed  CBC WITH DIFFERENTIAL - Abnormal; Notable for the following:    WBC 16.0 (*)     Neutro Abs 11.6 (*)     Monocytes Absolute 1.2 (*)     All other components within normal limits  COMPREHENSIVE METABOLIC PANEL - Abnormal; Notable for the following:    Potassium 3.4 (*)     Glucose, Bld 137 (*)     AST 309 (*)     ALT 210 (*)     All other components within normal limits  POCT I-STAT, CHEM 8 - Abnormal; Notable for the following:    Potassium 3.4 (*)     Glucose, Bld 133 (*)     All other components within normal limits  POCT I-STAT TROPONIN I  LIPASE, BLOOD  URINALYSIS, ROUTINE W REFLEX MICROSCOPIC  CULTURE, BLOOD (ROUTINE X 2)  CULTURE, BLOOD (ROUTINE X 2)  LACTIC ACID, PLASMA  URINE CULTURE   US Abdomen Complete  02/23/2012  *RADIOLOGY REPORT*  Clinical Data:  Abdominal and flank pain.  COMPLETE ABDOMINAL ULTRASOUND  Comparison:  None.  Findings:  Gallbladder:  Granular shadowing echogenic debris is seen in the gallbladder.  Wall measures 4 mm.  No sonographic Murphy's sign.  Common bile duct:  Measures 5 mm, within normal limits.  Liver:  Diffusely increased in echogenicity.  IVC:  Appears normal.  Pancreas:  No focal abnormality seen.  Spleen:  Measures 6.8 cm, negative.  Right Kidney:  Measures 12.4 cm.  Parenchymal echogenicity is normal.  No hydronephrosis.  No focal lesions.  Left Kidney:  Measures 12.3 cm.  Parenchymal echogenicity is normal.  No hydronephrosis.  No focal lesions.  Abdominal aorta:  No aneurysm identified.  IMPRESSION:  1. Granular cholelithiasis with slight wall thickening.  Absent sonographic Murphy's sign.  Findings can be seen with chronic cholecystitis. 2.  Hepatic steatosis.   Original Report Authenticated By: Reyes Ivan, M.D.    Dg Chest Port 1 View  02/23/2012  *RADIOLOGY REPORT*  Clinical Data: Epigastric pain radiating to back, nausea, vomiting  PORTABLE CHEST -  2 VIEW  Comparison: Portable exam 0959 hours compared to 09/16/2028  Findings: Enlargement of cardiac silhouette. Slight pulmonary vascular congestion. Mediastinal contours normal. Bronchitic changes with right basilar atelectasis. Lungs otherwise clear. No pleural effusion or pneumothorax. Bones unremarkable.  IMPRESSION: Mild enlargement of cardiac silhouette with slight pulmonary vascular congestion. Bronchitic changes with right basilar atelectasis.   Original Report Authenticated By: Lollie Marrow, M.D.      1. Acute pancreatitis   2. Chronic cholecystitis   3. Cholelithiasis   4. Hypokalemia       MDM  Paitiient given 1 mg dilaudid.  Pressure maintained, but patient with decreaed level of alertness, apneic episodes and desats to 87.  She is awake and alert now without apnea BP 93/48  Pulse 62  Temp 97.6 F (36.4 C) (Oral)  Resp 16  SpO2 100%    10:24 AM Transaminitis and elevated white count with low BP- Patient fits early sepsis criteria.  Drawing cultures.  Patient will receive abdominal US.  11:32 AM Patient is at ultrasound.  She has markedly elevated lipase    12:21 PM Patient is taking contrast dye for CT scan.  Giving more fentanyl and ondansetron.  I am awainting CT results to see if surgical or medicine consult.  3:23 PM BP 110/57  Pulse 63  Temp 97.6 F (36.4 C) (Oral)  Resp 16  SpO2 95% Patient being admitted by family medicine. Surgery will consult inpatient.  Arthor Captain, PA-C 02/23/12 1525  Arthor Captain, PA-C 02/23/12 2021

## 2012-02-23 NOTE — Consult Note (Signed)
Reason for Consult:Pancreatitis Referring Physician: Tiburcio Pea (ER-PA) Primary Care:  Dr. Sharalyn Ink  Mary Daniels is an 56 y.o. female.  HPI: 56y/o female who awoke with severe abdominal pain, mid epigastric, followed by nausea and vomiting.  She has not improved and came to the ER.  She had a similar episode about 2 months ago but it only lasted only 2 hours.  This has not resolved.  She has had trouble with apnea after pain meds including fentanyl and morphine. It also drops her blood pressure she is having severe ongoing pain even with analgesics. Work up shows debris in the Wheeler bladder, GB wall 4mm, negative murphy's sign.  Ct shows acute pancreatitis, with no other abnormalities. We are ask to see in consultation  Past medical history: Abdominal hysterectomy Schatzki  Rings with prior dilatations [facility] Allergies Asthma over 50 BMI 43.9   Past Surgical History  Procedure Date  . Abdominal Hysterectomy Left knee replacement 2009     No family history on file. FAther:  Hx of colon cancer Mother: living History of RA 2 Brothers living;  Good health Married/Housewife Social History:  reports that she has never smoked. She does not have any smokeless tobacco history on file. She reports that she does not use illicit drugs. Her alcohol history not on file.Tobacco: none  Drugs: none  ETOH: none  Allergies: No Known Allergies  Medications:  Prior to Admission medications   Medication Sig Start Date End Date Taking? Authorizing Provider  albuterol (PROVENTIL HFA;VENTOLIN HFA) 108 (90 BASE) MCG/ACT inhaler Inhale 2 puffs into the lungs every 6 (six) hours as needed. For wheezing   Yes Historical Provider, MD  cetirizine (ZYRTEC) 10 MG tablet Take 10 mg by mouth daily.   Yes Historical Provider, MD  montelukast (SINGULAIR) 10 MG tablet Take 10 mg by mouth daily.   Yes Historical Provider, MD  Multiple Vitamins-Minerals (ONE-A-DAY 50 PLUS) TABS Take 1 tablet by mouth daily.   Yes  Historical Provider, MD  omeprazole (PRILOSEC) 20 MG capsule Take 20 mg by mouth daily.   Yes Historical Provider, MD    Prior to Admission:  (Not in a hospital admission) Scheduled:   . fentaNYL  50 mcg Intravenous Once  . fentaNYL  50 mcg Intravenous Once  .  HYDROmorphone (DILAUDID) injection  1 mg Intravenous Once  .  morphine injection  1 mg Intravenous Once  . ondansetron (ZOFRAN) IV  4 mg Intravenous Once  . ondansetron (ZOFRAN) IV  4 mg Intravenous Once   Continuous:   . sodium chloride Stopped (02/23/12 1058)   Followed by  . sodium chloride 1,000 mL (02/23/12 1048)   Followed by  . sodium chloride     ZOX:WRUEAVW Anti-infectives    None      Results for orders placed during the hospital encounter of 02/23/12 (from the past 48 hour(s))  CBC WITH DIFFERENTIAL     Status: Abnormal   Collection Time   02/23/12  9:32 AM      Component Value Range Comment   WBC 16.0 (*) 4.0 - 10.5 K/uL    RBC 4.77  3.87 - 5.11 MIL/uL    Hemoglobin 13.9  12.0 - 15.0 g/dL    HCT 09.8  11.9 - 14.7 %    MCV 87.6  78.0 - 100.0 fL    MCH 29.1  26.0 - 34.0 pg    MCHC 33.3  30.0 - 36.0 g/dL    RDW 82.9  56.2 - 13.0 %  Platelets 274  150 - 400 K/uL    Neutrophils Relative 73  43 - 77 %    Neutro Abs 11.6 (*) 1.7 - 7.7 K/uL    Lymphocytes Relative 19  12 - 46 %    Lymphs Abs 3.0  0.7 - 4.0 K/uL    Monocytes Relative 8  3 - 12 %    Monocytes Absolute 1.2 (*) 0.1 - 1.0 K/uL    Eosinophils Relative 1  0 - 5 %    Eosinophils Absolute 0.1  0.0 - 0.7 K/uL    Basophils Relative 0  0 - 1 %    Basophils Absolute 0.0  0.0 - 0.1 K/uL   COMPREHENSIVE METABOLIC PANEL     Status: Abnormal   Collection Time   02/23/12  9:32 AM      Component Value Range Comment   Sodium 139  135 - 145 mEq/L    Potassium 3.4 (*) 3.5 - 5.1 mEq/L    Chloride 102  96 - 112 mEq/L    CO2 28  19 - 32 mEq/L    Glucose, Bld 137 (*) 70 - 99 mg/dL    BUN 15  6 - 23 mg/dL    Creatinine, Ser 1.19  0.50 - 1.10 mg/dL     Calcium 9.5  8.4 - 10.5 mg/dL    Total Protein 7.5  6.0 - 8.3 g/dL    Albumin 3.6  3.5 - 5.2 g/dL    AST 147 (*) 0 - 37 U/L    ALT 210 (*) 0 - 35 U/L    Alkaline Phosphatase 98  39 - 117 U/L    Total Bilirubin 0.6  0.3 - 1.2 mg/dL    GFR calc non Af Amer >90  >90 mL/min    GFR calc Af Amer >90  >90 mL/min   LIPASE, BLOOD     Status: Abnormal   Collection Time   02/23/12  9:32 AM      Component Value Range Comment   Lipase >3000 (*) 11 - 59 U/L RESULT CONFIRMED BY AUTOMATED DILUTION  LACTATE DEHYDROGENASE     Status: Abnormal   Collection Time   02/23/12  9:32 AM      Component Value Range Comment   LDH 474 (*) 94 - 250 U/L HEMOLYSIS AT THIS LEVEL MAY AFFECT RESULT  POCT I-STAT TROPONIN I     Status: Normal   Collection Time   02/23/12  9:44 AM      Component Value Range Comment   Troponin i, poc 0.00  0.00 - 0.08 ng/mL    Comment 3            POCT I-STAT, CHEM 8     Status: Abnormal   Collection Time   02/23/12  9:46 AM      Component Value Range Comment   Sodium 142  135 - 145 mEq/L    Potassium 3.4 (*) 3.5 - 5.1 mEq/L    Chloride 101  96 - 112 mEq/L    BUN 16  6 - 23 mg/dL    Creatinine, Ser 8.29  0.50 - 1.10 mg/dL    Glucose, Bld 562 (*) 70 - 99 mg/dL    Calcium, Ion 1.30  8.65 - 1.23 mmol/L    TCO2 28  0 - 100 mmol/L    Hemoglobin 14.6  12.0 - 15.0 g/dL    HCT 78.4  69.6 - 29.5 %   LACTIC ACID, PLASMA     Status:  Abnormal   Collection Time   02/23/12 10:40 AM      Component Value Range Comment   Lactic Acid, Venous 3.0 (*) 0.5 - 2.2 mmol/L   URINALYSIS, ROUTINE W REFLEX MICROSCOPIC     Status: Abnormal   Collection Time   02/23/12 11:09 AM      Component Value Range Comment   Color, Urine YELLOW  YELLOW    APPearance CLEAR  CLEAR    Specific Gravity, Urine 1.018  1.005 - 1.030    pH 5.5  5.0 - 8.0    Glucose, UA NEGATIVE  NEGATIVE mg/dL    Hgb urine dipstick NEGATIVE  NEGATIVE    Bilirubin Urine NEGATIVE  NEGATIVE    Ketones, ur NEGATIVE  NEGATIVE mg/dL     Protein, ur NEGATIVE  NEGATIVE mg/dL    Urobilinogen, UA 1.0  0.0 - 1.0 mg/dL    Nitrite NEGATIVE  NEGATIVE    Leukocytes, UA SMALL (*) NEGATIVE   URINE MICROSCOPIC-ADD ON     Status: Normal   Collection Time   02/23/12 11:09 AM      Component Value Range Comment   Squamous Epithelial / LPF RARE  RARE    WBC, UA 0-2  <3 WBC/hpf    RBC / HPF 0-2  <3 RBC/hpf    Bacteria, UA RARE  RARE     US Abdomen Complete  02/23/2012  *RADIOLOGY REPORT*  Clinical Data:  Abdominal and flank pain.  COMPLETE ABDOMINAL ULTRASOUND  Comparison:  None.  Findings:  Gallbladder:  Granular shadowing echogenic debris is seen in the gallbladder.  Wall measures 4 mm.  No sonographic Murphy's sign.  Common bile duct:  Measures 5 mm, within normal limits.  Liver:  Diffusely increased in echogenicity.  IVC:  Appears normal.  Pancreas:  No focal abnormality seen.  Spleen:  Measures 6.8 cm, negative.  Right Kidney:  Measures 12.4 cm.  Parenchymal echogenicity is normal.  No hydronephrosis.  No focal lesions.  Left Kidney:  Measures 12.3 cm.  Parenchymal echogenicity is normal.  No hydronephrosis.  No focal lesions.  Abdominal aorta:  No aneurysm identified.  IMPRESSION:  1. Granular cholelithiasis with slight wall thickening.  Absent sonographic Murphy's sign.  Findings can be seen with chronic cholecystitis. 2.  Hepatic steatosis.   Original Report Authenticated By: Reyes Ivan, M.D.    Ct Abdomen Pelvis W Contrast  02/23/2012  *RADIOLOGY REPORT*  Clinical Data: Abdominal pain and flank pain.  Nausea and vomiting.  CT ABDOMEN AND PELVIS WITH CONTRAST  Technique:  Multidetector CT imaging of the abdomen and pelvis was performed following the standard protocol during bolus administration of intravenous contrast.  Contrast: OMNIPAQUE IOHEXOL 300 MG/ML  SOLN  Comparison: Ultrasound abdomen 02/23/2012.  Findings: Lung bases show dependent atelectasis and/or scarring. Heart size within normal limits.  No pericardial or  pleural effusion.  Small hiatal hernia.  Liver, gallbladder, adrenal glands, kidneys and spleen are unremarkable.  The pancreas is hazy in appearance with surrounding inflammatory stranding and fluid.  The gland is uniform in attenuation, without areas of necrosis or organized fluid.  Stomach and bowel are unremarkable.  No pathologically enlarged lymph nodes.  No free fluid.  No worrisome lytic or sclerotic lesions.  IMPRESSION: Acute pancreatitis without complicating feature.   Original Report Authenticated By: Reyes Ivan, M.D.    Dg Chest Port 1 View  02/23/2012  *RADIOLOGY REPORT*  Clinical Data: Epigastric pain radiating to back, nausea, vomiting  PORTABLE CHEST -  2 VIEW  Comparison: Portable exam 0959 hours compared to 09/16/2028  Findings: Enlargement of cardiac silhouette. Slight pulmonary vascular congestion. Mediastinal contours normal. Bronchitic changes with right basilar atelectasis. Lungs otherwise clear. No pleural effusion or pneumothorax. Bones unremarkable.  IMPRESSION: Mild enlargement of cardiac silhouette with slight pulmonary vascular congestion. Bronchitic changes with right basilar atelectasis.   Original Report Authenticated By: Lollie Marrow, M.D.     Review of Systems  Constitutional: Positive for weight loss (she has been dieting some). Negative for fever, chills, malaise/fatigue and diaphoresis.  HENT: Negative.   Eyes: Negative.   Respiratory: Positive for cough (some green sputum yesterday started mucinex), sputum production (as noted above) and wheezing (some earlier this AM but not now.). Negative for hemoptysis and shortness of breath.        She snores more than her husband.  Cardiovascular: Negative.  Negative for chest pain, palpitations, orthopnea, claudication, leg swelling and PND.       She did have a stress test in the past with concerns for her breathing. It is reported normal.  Gastrointestinal: Positive for heartburn (well controlled with PPI),  nausea, vomiting, abdominal pain (mid epigastric woke her up) and diarrhea (last week but not currently). Negative for constipation, blood in stool and melena.  Genitourinary: Negative.   Musculoskeletal: Positive for back pain.       Some sciatica since her knee replacement  Skin: Negative.   Neurological: Negative.  Negative for weakness.  Endo/Heme/Allergies: Negative.   Psychiatric/Behavioral: Negative.    Blood pressure 110/57, pulse 63, temperature 97.6 F (36.4 C), temperature source Oral, resp. rate 16, SpO2 95.00%. Physical Exam  Constitutional: She appears well-developed and well-nourished.       BMI 43.9  HENT:  Head: Normocephalic and atraumatic.  Nose: Nose normal.  Eyes: Conjunctivae normal and EOM are normal. Pupils are equal, round, and reactive to light. Right eye exhibits no discharge. Left eye exhibits no discharge. No scleral icterus.  Neck: Normal range of motion. Neck supple. No JVD present. No tracheal deviation present. No thyromegaly present.  Cardiovascular: Normal rate, regular rhythm, normal heart sounds and intact distal pulses.  Exam reveals no gallop.   No murmur heard. Respiratory: Effort normal. No stridor. No respiratory distress. She has no wheezes. She has no rales. She exhibits no tenderness.       She gets apneic with pain med, dilaudid and fentanyl.   GI: Soft. Bowel sounds are normal. She exhibits distension. She exhibits no mass. There is tenderness (mid epigastric). There is no rebound and no guarding.  Musculoskeletal: Normal range of motion. She exhibits no edema and no tenderness.  Lymphadenopathy:    She has no cervical adenopathy.  Neurological: She is alert. No cranial nerve deficit.  Skin: Skin is warm and dry. No rash noted. No erythema. No pallor.  Psychiatric: She has a normal mood and affect. Her behavior is normal. Judgment and thought content normal.    Assessment/Plan: 1.  Pancreatitis with debris in her GB on [place]. 2.Asthma over  50 3.Schatzki rings/hx of dilatation/GERD controlled on PPI 4.BMI 43.9 4.Probable sleep apnea 5.Elevated lactate 6.Apnic episodes with narcotics  Plan:  Medicine will admit and help manage her medically, bowel rest, hydration, and we will follow.  Plan cholecystectomy after pancreatitis has resolved.  Will Marlyne Beards PA-C for DR. Gwendlyn Deutscher.   Lenon Kuennen 02/23/2012, 3:07 PM

## 2012-02-23 NOTE — Progress Notes (Signed)
1810 Patient arrived  To room from ED placed on telemetry.

## 2012-02-23 NOTE — ED Notes (Signed)
Returned from U/S

## 2012-02-23 NOTE — ED Provider Notes (Signed)
Medical screening examination/treatment/procedure(s) were performed by non-physician practitioner and as supervising physician I was immediately available for consultation/collaboration.   David H Yao, MD 02/23/12 1539 

## 2012-02-23 NOTE — ED Notes (Signed)
Pt presents to ED with abdominal and flank pain. Pt states symptoms started worse today. Pt also nausea

## 2012-02-24 LAB — CBC
HCT: 37.1 % (ref 36.0–46.0)
Hemoglobin: 12.3 g/dL (ref 12.0–15.0)
MCH: 29.2 pg (ref 26.0–34.0)
MCHC: 33.2 g/dL (ref 30.0–36.0)
MCV: 88.1 fL (ref 78.0–100.0)
Platelets: 230 10*3/uL (ref 150–400)
RBC: 4.21 MIL/uL (ref 3.87–5.11)
RDW: 13.3 % (ref 11.5–15.5)
WBC: 12.2 10*3/uL — ABNORMAL HIGH (ref 4.0–10.5)

## 2012-02-24 LAB — COMPREHENSIVE METABOLIC PANEL
ALT: 159 U/L — ABNORMAL HIGH (ref 0–35)
Alkaline Phosphatase: 91 U/L (ref 39–117)
BUN: 11 mg/dL (ref 6–23)
Chloride: 103 mEq/L (ref 96–112)
GFR calc Af Amer: >90 mL/min (ref >90)
Glucose, Bld: 103 mg/dL — ABNORMAL HIGH (ref 70–99)
Potassium: 3.4 mEq/L — ABNORMAL LOW (ref 3.5–5.1)
Sodium: 138 mEq/L (ref 135–145)
Total Bilirubin: 0.6 mg/dL (ref 0.3–1.2)
Total Protein: 6.5 g/dL (ref 6.0–8.3)

## 2012-02-24 LAB — URINALYSIS, ROUTINE W REFLEX MICROSCOPIC
Glucose, UA: NEGATIVE mg/dL
Hgb urine dipstick: NEGATIVE
Ketones, ur: 15 mg/dL — AB
Nitrite: NEGATIVE
Protein, ur: 30 mg/dL — AB
Specific Gravity, Urine: 1.033 — ABNORMAL HIGH (ref 1.005–1.030)
Urobilinogen, UA: 0.2 mg/dL (ref 0.0–1.0)
pH: 6 (ref 5.0–8.0)

## 2012-02-24 LAB — URINE MICROSCOPIC-ADD ON

## 2012-02-24 LAB — URINE CULTURE: Colony Count: 8000

## 2012-02-24 LAB — LIPID PANEL
Cholesterol: 151 mg/dL (ref 0–200)
HDL: 39 mg/dL — ABNORMAL LOW (ref >39)
LDL Cholesterol: 86 mg/dL (ref 0–99)
Total CHOL/HDL Ratio: 3.9 RATIO
Triglycerides: 130 mg/dL (ref <150)
VLDL: 26 mg/dL (ref 0–40)

## 2012-02-24 LAB — GLUCOSE, CAPILLARY: Glucose-Capillary: 99 mg/dL (ref 70–99)

## 2012-02-24 LAB — LIPASE, BLOOD: Lipase: 1290 U/L — ABNORMAL HIGH (ref 11–59)

## 2012-02-24 MED ORDER — ACETAMINOPHEN 10 MG/ML IV SOLN: 1000.0000 mg | Freq: Four times a day (QID) | INTRAVENOUS | Status: DC

## 2012-02-24 MED ORDER — MORPHINE SULFATE 2 MG/ML IJ SOLN
2.0000 mg | INTRAMUSCULAR | Status: DC | PRN
  Administered 2012-02-24: 2 mg via INTRAVENOUS

## 2012-02-24 MED ORDER — HYDROMORPHONE HCL PF 1 MG/ML IJ SOLN
1.0000 mg | INTRAMUSCULAR | Status: DC | PRN
  Administered 2012-02-24 – 2012-02-26 (×17): 1 mg via INTRAVENOUS
  Filled 2012-02-24 (×17): qty 1

## 2012-02-24 NOTE — Progress Notes (Signed)
Subjective: Remains in modderate level of discomfort, no nausea, also c/o of "painful urination with some blood in it yesterday evening". No repeat of this c/o since. Denies fever or chills, urgency.   Objective: Vital signs in last 24 hours: Temp:  [98 F (36.7 C)-98.5 F (36.9 C)] 98.1 F (36.7 C) (10/22 0600) Pulse Rate:  [54-72] 72  (10/22 0600) Resp:  [12-20] 20  (10/22 0600) BP: (103-130)/(57-76) 109/68 mmHg (10/22 0600) SpO2:  [92 %-100 %] 97 % (10/22 0600) Weight:  [249 lb 5.4 oz (113.1 kg)] 249 lb 5.4 oz (113.1 kg) (10/21 1800) Last BM Date: 02/23/12  Intake/Output from previous day: 10/21 0701 - 10/22 0700 In: 625 [I.V.:625] Out: -  Intake/Output this shift:    General appearance: alert, cooperative, appears stated age and moderate distress Chest: CTA bilaterally Cardiac: RRR Abdomen: still very sore mid epigastric region with radiation to back (not fully relieved with Morphine)  Extremities: No edema + pulses, warm to touch, no tenderness. Labs: WBC has improved, Lipase improved, LFTs improving, mildly hypokalemic.BUN and Creatine wnl.  Lab Results:   Basename 02/24/12 0625 02/23/12 0946 02/23/12 0932  WBC 12.2* -- 16.0*  HGB 12.3 14.6 --  HCT 37.1 43.0 --  PLT 230 -- 274   BMET  Basename 02/24/12 0625 02/23/12 0946 02/23/12 0932  NA 138 142 --  K 3.4* 3.4* --  CL 103 101 --  CO2 28 -- 28  GLUCOSE 103* 133* --  BUN 11 16 --  CREATININE 0.69 1.00 --  CALCIUM 8.6 -- 9.5   PT/INR No results found for this basename: LABPROT:2,INR:2 in the last 72 hours ABG No results found for this basename: PHART:2,PCO2:2,PO2:2,HCO3:2 in the last 72 hours  Studies/Results: US Abdomen Complete  02/23/2012  *RADIOLOGY REPORT*  Clinical Data:  Abdominal and flank pain.  COMPLETE ABDOMINAL ULTRASOUND  Comparison:  None.  Findings:  Gallbladder:  Granular shadowing echogenic debris is seen in the gallbladder.  Wall measures 4 mm.  No sonographic Murphy's sign.  Common  bile duct:  Measures 5 mm, within normal limits.  Liver:  Diffusely increased in echogenicity.  IVC:  Appears normal.  Pancreas:  No focal abnormality seen.  Spleen:  Measures 6.8 cm, negative.  Right Kidney:  Measures 12.4 cm.  Parenchymal echogenicity is normal.  No hydronephrosis.  No focal lesions.  Left Kidney:  Measures 12.3 cm.  Parenchymal echogenicity is normal.  No hydronephrosis.  No focal lesions.  Abdominal aorta:  No aneurysm identified.  IMPRESSION:  1. Granular cholelithiasis with slight wall thickening.  Absent sonographic Murphy's sign.  Findings can be seen with chronic cholecystitis. 2.  Hepatic steatosis.   Original Report Authenticated By: Reyes Ivan, M.D.    Ct Abdomen Pelvis W Contrast  02/23/2012  *RADIOLOGY REPORT*  Clinical Data: Abdominal pain and flank pain.  Nausea and vomiting.  CT ABDOMEN AND PELVIS WITH CONTRAST  Technique:  Multidetector CT imaging of the abdomen and pelvis was performed following the standard protocol during bolus administration of intravenous contrast.  Contrast: OMNIPAQUE IOHEXOL 300 MG/ML  SOLN  Comparison: Ultrasound abdomen 02/23/2012.  Findings: Lung bases show dependent atelectasis and/or scarring. Heart size within normal limits.  No pericardial or pleural effusion.  Small hiatal hernia.  Liver, gallbladder, adrenal glands, kidneys and spleen are unremarkable.  The pancreas is hazy in appearance with surrounding inflammatory stranding and fluid.  The gland is uniform in attenuation, without areas of necrosis or organized fluid.  Stomach and bowel are unremarkable.  No pathologically enlarged lymph nodes.  No free fluid.  No worrisome lytic or sclerotic lesions.  IMPRESSION: Acute pancreatitis without complicating feature.   Original Report Authenticated By: Reyes Ivan, M.D.    Dg Chest Port 1 View  02/23/2012  *RADIOLOGY REPORT*  Clinical Data: Epigastric pain radiating to back, nausea, vomiting  PORTABLE CHEST - 2 VIEW   Comparison: Portable exam 0959 hours compared to 09/16/2028  Findings: Enlargement of cardiac silhouette. Slight pulmonary vascular congestion. Mediastinal contours normal. Bronchitic changes with right basilar atelectasis. Lungs otherwise clear. No pleural effusion or pneumothorax. Bones unremarkable.  IMPRESSION: Mild enlargement of cardiac silhouette with slight pulmonary vascular congestion. Bronchitic changes with right basilar atelectasis.   Original Report Authenticated By: Lollie Marrow, M.D.     Anti-infectives: Anti-infectives    None      Assessment/Plan:  Patient Active Problem List  Diagnosis  . ASTHMA  . Acute pancreatitis  . Chronic cholecystitis  . Cholelithiasis  . Obesity, Class III, BMI 40-49.9 (morbid obesity)  . Hypokalemia   s/p * No surgery found * 1. Continue NPO status 2. IVF 3. Pain management (will change MS to Dilaudud) 4. Send UA to r/o UTI 5. Continue supportive care, follow clinical picture and labs.    LOS: 1 day    Keymora Grillot 02/24/2012

## 2012-02-24 NOTE — Care Management Note (Unsigned)
    Page 1 of 1   02/24/2012     4:52:01 PM   CARE MANAGEMENT NOTE 02/24/2012  Patient:  Mary Daniels, Mary Daniels   Account Number:  [id number]  Date Initiated:  02/24/2012  Documentation initiated by:  Letha Cape  Subjective/Objective Assessment:   dx acute pancreatitis  admit- lives with spouse.     Action/Plan:   Anticipated DC Date:  02/27/2012   Anticipated DC Plan:  HOME/SELF CARE      DC Planning Services  CM consult      Choice offered to / List presented to:             Status of service:  In process, will continue to follow Medicare Important Message given?   (If response is "NO", the following Medicare IM given date fields will be blank) Date Medicare IM given:   Date Additional Medicare IM given:    Discharge Disposition:    Per UR Regulation:  Reviewed for med. necessity/level of care/duration of stay  If discussed at Long Length of Stay Meetings, dates discussed:    Comments:  02/24/12 16:51 Letha Cape RN, BSN [phone] patient lives with spouse,  NCM will continue to follow for dc needs.

## 2012-02-24 NOTE — Progress Notes (Signed)
Internal Medicine Teaching Service Attending Note Date: 02/24/2012  Patient name: Mary Daniels  Medical record number: [id number]  Date of birth: July 14, 1955   I have seen and evaluated Mary Daniels and discussed their care with the Residency Team.   continued pain.    . sodium chloride  1,000 mL Intravenous Once   Followed by  . sodium chloride  1,000 mL Intravenous Once  . enoxaparin (LOVENOX) injection  40 mg Subcutaneous Q24H  . fentaNYL  50 mcg Intravenous Once  . fentaNYL  50 mcg Intravenous Once  . ketorolac  30 mg Intravenous Q6H  . loratadine  10 mg Oral Daily  . montelukast  10 mg Oral Daily  .  morphine injection  1 mg Intravenous Once  .  morphine injection  1 mg Intravenous Once  . ondansetron (ZOFRAN) IV  4 mg Intravenous Once  . pantoprazole  40 mg Oral Daily  . senna-docusate  1 tablet Oral BID  . sodium chloride  3 mL Intravenous Q12H    Physical Exam: Blood pressure 109/68, pulse 72, temperature 98.1 F (36.7 C), temperature source Oral, resp. rate 20, weight 113.1 kg (249 lb 5.4 oz), SpO2 97.00%. General appearance: alert, cooperative and mild distress Resp: clear to auscultation bilaterally Cardio: regular rate and rhythm GI: normal findings: soft and abnormal findings:  hypoactive bowel sounds, obese and tender in midline, no r/g  Lab results: Results for orders placed during the hospital encounter of 02/23/12 (from the past 24 hour(s))  CULTURE, BLOOD (ROUTINE X 2)     Status: Normal (Preliminary result)   Collection Time   02/23/12 10:35 AM      Component Value Range   Specimen Description BLOOD HAND LEFT     Special Requests BOTTLES DRAWN AEROBIC ONLY 3CC     Culture  Setup Time 02/23/2012 18:24     Culture       Value:        BLOOD CULTURE RECEIVED NO GROWTH TO DATE CULTURE WILL BE HELD FOR 5 DAYS BEFORE ISSUING A FINAL NEGATIVE REPORT   Report Status PENDING    CULTURE, BLOOD (ROUTINE X 2)     Status: Normal (Preliminary result)   Collection Time   02/23/12 10:40 AM      Component Value Range   Specimen Description BLOOD HAND RIGHT     Special Requests BOTTLES DRAWN AEROBIC ONLY 5CC     Culture  Setup Time 02/23/2012 18:24     Culture       Value:        BLOOD CULTURE RECEIVED NO GROWTH TO DATE CULTURE WILL BE HELD FOR 5 DAYS BEFORE ISSUING A FINAL NEGATIVE REPORT   Report Status PENDING    LACTIC ACID, PLASMA     Status: Abnormal   Collection Time   02/23/12 10:40 AM      Component Value Range   Lactic Acid, Venous 3.0 (*) 0.5 - 2.2 mmol/L  URINALYSIS, ROUTINE W REFLEX MICROSCOPIC     Status: Abnormal   Collection Time   02/23/12 11:09 AM      Component Value Range   Color, Urine YELLOW  YELLOW   APPearance CLEAR  CLEAR   Specific Gravity, Urine 1.018  1.005 - 1.030   pH 5.5  5.0 - 8.0   Glucose, UA NEGATIVE  NEGATIVE mg/dL   Hgb urine dipstick NEGATIVE  NEGATIVE   Bilirubin Urine NEGATIVE  NEGATIVE   Ketones, ur NEGATIVE  NEGATIVE mg/dL   Protein,  ur NEGATIVE  NEGATIVE mg/dL   Urobilinogen, UA 1.0  0.0 - 1.0 mg/dL   Nitrite NEGATIVE  NEGATIVE   Leukocytes, UA SMALL (*) NEGATIVE  URINE MICROSCOPIC-ADD ON     Status: Normal   Collection Time   02/23/12 11:09 AM      Component Value Range   Squamous Epithelial / LPF RARE  RARE   WBC, UA 0-2  <3 WBC/hpf   RBC / HPF 0-2  <3 RBC/hpf   Bacteria, UA RARE  RARE  URINE CULTURE     Status: Normal   Collection Time   02/23/12 11:10 AM      Component Value Range   Specimen Description URINE, RANDOM     Special Requests NONE     Culture  Setup Time 02/23/2012 11:40     Colony Count 8,000 COLONIES/ML     Culture INSIGNIFICANT GROWTH     Report Status 02/24/2012 FINAL    CBC     Status: Abnormal   Collection Time   02/24/12  6:25 AM      Component Value Range   WBC 12.2 (*) 4.0 - 10.5 K/uL   RBC 4.21  3.87 - 5.11 MIL/uL   Hemoglobin 12.3  12.0 - 15.0 g/dL   HCT 16.1  09.6 - 04.5 %   MCV 88.1  78.0 - 100.0 fL   MCH 29.2  26.0 - 34.0 pg   MCHC 33.2   30.0 - 36.0 g/dL   RDW 40.9  81.1 - 91.4 %   Platelets 230  150 - 400 K/uL  COMPREHENSIVE METABOLIC PANEL     Status: Abnormal   Collection Time   02/24/12  6:25 AM      Component Value Range   Sodium 138  135 - 145 mEq/L   Potassium 3.4 (*) 3.5 - 5.1 mEq/L   Chloride 103  96 - 112 mEq/L   CO2 28  19 - 32 mEq/L   Glucose, Bld 103 (*) 70 - 99 mg/dL   BUN 11  6 - 23 mg/dL   Creatinine, Ser 7.82  0.50 - 1.10 mg/dL   Calcium 8.6  8.4 - 95.6 mg/dL   Total Protein 6.5  6.0 - 8.3 g/dL   Albumin 3.1 (*) 3.5 - 5.2 g/dL   AST 213 (*) 0 - 37 U/L   ALT 159 (*) 0 - 35 U/L   Alkaline Phosphatase 91  39 - 117 U/L   Total Bilirubin 0.6  0.3 - 1.2 mg/dL   GFR calc non Af Amer >90  >90 mL/min   GFR calc Af Amer >90  >90 mL/min  LIPASE, BLOOD     Status: Abnormal   Collection Time   02/24/12  6:25 AM      Component Value Range   Lipase 1290 (*) 11 - 59 U/L  LIPID PANEL     Status: Abnormal   Collection Time   02/24/12  6:25 AM      Component Value Range   Cholesterol 151  0 - 200 mg/dL   Triglycerides 086  <578 mg/dL   HDL 39 (*) >46 mg/dL   Total CHOL/HDL Ratio 3.9     VLDL 26  0 - 40 mg/dL   LDL Cholesterol 86  0 - 99 mg/dL  GLUCOSE, CAPILLARY     Status: Normal   Collection Time   02/24/12  7:49 AM      Component Value Range   Glucose-Capillary 99  70 - 99 mg/dL  Imaging results:  US Abdomen Complete  02/23/2012  *RADIOLOGY REPORT*  Clinical Data:  Abdominal and flank pain.  COMPLETE ABDOMINAL ULTRASOUND  Comparison:  None.  Findings:  Gallbladder:  Granular shadowing echogenic debris is seen in the gallbladder.  Wall measures 4 mm.  No sonographic Murphy's sign.  Common bile duct:  Measures 5 mm, within normal limits.  Liver:  Diffusely increased in echogenicity.  IVC:  Appears normal.  Pancreas:  No focal abnormality seen.  Spleen:  Measures 6.8 cm, negative.  Right Kidney:  Measures 12.4 cm.  Parenchymal echogenicity is normal.  No hydronephrosis.  No focal lesions.  Left Kidney:   Measures 12.3 cm.  Parenchymal echogenicity is normal.  No hydronephrosis.  No focal lesions.  Abdominal aorta:  No aneurysm identified.  IMPRESSION:  1. Granular cholelithiasis with slight wall thickening.  Absent sonographic Murphy's sign.  Findings can be seen with chronic cholecystitis. 2.  Hepatic steatosis.   Original Report Authenticated By: Reyes Ivan, M.D.    Ct Abdomen Pelvis W Contrast  02/23/2012  *RADIOLOGY REPORT*  Clinical Data: Abdominal pain and flank pain.  Nausea and vomiting.  CT ABDOMEN AND PELVIS WITH CONTRAST  Technique:  Multidetector CT imaging of the abdomen and pelvis was performed following the standard protocol during bolus administration of intravenous contrast.  Contrast: OMNIPAQUE IOHEXOL 300 MG/ML  SOLN  Comparison: Ultrasound abdomen 02/23/2012.  Findings: Lung bases show dependent atelectasis and/or scarring. Heart size within normal limits.  No pericardial or pleural effusion.  Small hiatal hernia.  Liver, gallbladder, adrenal glands, kidneys and spleen are unremarkable.  The pancreas is hazy in appearance with surrounding inflammatory stranding and fluid.  The gland is uniform in attenuation, without areas of necrosis or organized fluid.  Stomach and bowel are unremarkable.  No pathologically enlarged lymph nodes.  No free fluid.  No worrisome lytic or sclerotic lesions.  IMPRESSION: Acute pancreatitis without complicating feature.   Original Report Authenticated By: Reyes Ivan, M.D.    Dg Chest Port 1 View  02/23/2012  *RADIOLOGY REPORT*  Clinical Data: Epigastric pain radiating to back, nausea, vomiting  PORTABLE CHEST - 2 VIEW  Comparison: Portable exam 0959 hours compared to 09/16/2028  Findings: Enlargement of cardiac silhouette. Slight pulmonary vascular congestion. Mediastinal contours normal. Bronchitic changes with right basilar atelectasis. Lungs otherwise clear. No pleural effusion or pneumothorax. Bones unremarkable.  IMPRESSION: Mild  enlargement of cardiac silhouette with slight pulmonary vascular congestion. Bronchitic changes with right basilar atelectasis.   Original Report Authenticated By: Lollie Marrow, M.D.     Assessment and Plan: I agree with the formulated Assessment and Plan with the following changes:   Acute Pancreatitis  Cholelithiasis, granular  Asthma   PPI  Keep NPO except ice chips  IV hydration  Hold anbx for now  Appreciate surgical f/u Pain medication (so far is tolerating this well, without respiratory compromise)

## 2012-02-24 NOTE — Progress Notes (Signed)
See details in note of JD,NP. She feels much better today. Abd less tender ,softer  Lipase improving  Imp resolving pancreatitis Plan: will need lap chole when pancreatitis reslves

## 2012-02-24 NOTE — ED Provider Notes (Signed)
Medical screening examination/treatment/procedure(s) were performed by non-physician practitioner and as supervising physician I was immediately available for consultation/collaboration.   David H Yao, MD 02/24/12 0730 

## 2012-02-24 NOTE — Consult Note (Signed)
Patient seen and examined and discusses with WJ,PA. Plans as noted above

## 2012-02-24 NOTE — Progress Notes (Signed)
Medical Student Daily Progress Note   Subjective:    Interval Events:   No acute events overnight.  Pt continues to complain of constant dull mid-epigastric pain that radiates to the back.  This pain occasionally changes from dull to sharp especially with maneuvers that increase internal pressure (coughing and hiccuping).  The character of pain is unchanged from admission.  Pt reports that current pain regimen does help some bringing the pain down to 5/10 from 10/10 but this relief lasts only 1-2 hours.  She does say that a change in position (leaning forward) helps relieve some of the pain.  Pt endorses nausea and non-bloody/non-bilious emesis however none has been charted in the I/O's.  Pt denies bowel movement and flatulence since Monday evening.  Pt denies fever, chills, SOB, headache, or any other pain.  No new symptoms have developed overnight.    Objective:    Vital Signs:   Temp:  [98 F (36.7 C)-98.5 F (36.9 C)] 98.1 F (36.7 C) (10/22 0600) Pulse Rate:  [63-72] 72  (10/22 0600) Resp:  [12-20] 20  (10/22 0600) BP: (105-130)/(57-76) 109/68 mmHg (10/22 0600) SpO2:  [92 %-99 %] 97 % (10/22 0600) Weight:  [113.1 kg (249 lb 5.4 oz)] 113.1 kg (249 lb 5.4 oz) (10/21 1800) Last BM Date: 02/23/12   Weights: Filed Weights   02/23/12 1800  Weight: 113.1 kg (249 lb 5.4 oz)   Net since admission:  No change   Intake/Output:   Intake/Output Summary (Last 24 hours) at 02/24/12 1129 Last data filed at 02/24/12 0500  Gross per 24 hour  Intake    625 ml  Output      0 ml  Net    625 ml     Net since admission:  625   Physical Exam: GENERAL:  alert and oriented; resting uncomfortably in chair EYES:  pupils equal, round, sclera anicteric ENT:  moist mucosa LUNGS:  clear to auscultation bilaterally, normal work of breathing HEART:  normal rate; regular rhythm; normal S1 and S2, no S3 or S4 appreciated; no murmurs, rubs, or clicks ABDOMEN:  soft, tenderness to light palpation  noted in mid-epigastric region, normal bowel sounds, no masses palpated but palpation limited d/t pain NEURO: CN's III-XII grossly intact, pt moved all four extremities spontaneously SKIN:  Negative for rash PSYCHIATRIC: Mood appropriate for interaction    Labs: Basic Metabolic Panel:  Lab 02/24/12 1610 02/23/12 0946 02/23/12 0932  NA 138 142 139  K 3.4* 3.4* 3.4*  CL 103 101 102  CO2 28 -- 28  GLUCOSE 103* 133* 137*  BUN 11 16 15   CREATININE 0.69 1.00 0.78  CALCIUM 8.6 -- 9.5  MG -- -- 1.9  PHOS -- -- --    Liver Function Tests:  Lab 02/24/12 0625 02/23/12 0932  AST 102* 309*  ALT 159* 210*  ALKPHOS 91 98  BILITOT 0.6 0.6  PROT 6.5 7.5  ALBUMIN 3.1* 3.6    Lab 02/24/12 0625 02/23/12 0932  LIPASE 1290* >3000*  AMYLASE -- --   CBC:  Lab 02/24/12 0625 02/23/12 0946 02/23/12 0932  WBC 12.2* -- 16.0*  NEUTROABS -- -- 11.6*  HGB 12.3 14.6 13.9  HCT 37.1 43.0 41.8  MCV 88.1 -- 87.6  PLT 230 -- 274   CBG:  Lab 02/24/12 0749  GLUCAP 99   Microbiology: Results for orders placed during the hospital encounter of 02/23/12  CULTURE, BLOOD (ROUTINE X 2)     Status: Normal (Preliminary result)   Collection  Time   02/23/12 10:35 AM      Component Value Range Status Comment   Specimen Description BLOOD HAND LEFT   Final    Special Requests BOTTLES DRAWN AEROBIC ONLY 3CC   Final    Culture  Setup Time 02/23/2012 18:24   Final    Culture     Final    Value:        BLOOD CULTURE RECEIVED NO GROWTH TO DATE CULTURE WILL BE HELD FOR 5 DAYS BEFORE ISSUING A FINAL NEGATIVE REPORT   Report Status PENDING   Incomplete   CULTURE, BLOOD (ROUTINE X 2)     Status: Normal (Preliminary result)   Collection Time   02/23/12 10:40 AM      Component Value Range Status Comment   Specimen Description BLOOD HAND RIGHT   Final    Special Requests BOTTLES DRAWN AEROBIC ONLY 5CC   Final    Culture  Setup Time 02/23/2012 18:24   Final    Culture     Final    Value:        BLOOD CULTURE  RECEIVED NO GROWTH TO DATE CULTURE WILL BE HELD FOR 5 DAYS BEFORE ISSUING A FINAL NEGATIVE REPORT   Report Status PENDING   Incomplete   URINE CULTURE     Status: Normal   Collection Time   02/23/12 11:10 AM      Component Value Range Status Comment   Specimen Description URINE, RANDOM   Final    Special Requests NONE   Final    Culture  Setup Time 02/23/2012 11:40   Final    Colony Count 8,000 COLONIES/ML   Final    Culture INSIGNIFICANT GROWTH   Final    Report Status 02/24/2012 FINAL   Final     Imaging: US Abdomen Complete  02/23/2012  *RADIOLOGY REPORT*  Clinical Data:  Abdominal and flank pain.  COMPLETE ABDOMINAL ULTRASOUND  Comparison:  None.  Findings:  Gallbladder:  Granular shadowing echogenic debris is seen in the gallbladder.  Wall measures 4 mm.  No sonographic Murphy's sign.  Common bile duct:  Measures 5 mm, within normal limits.  Liver:  Diffusely increased in echogenicity.  IVC:  Appears normal.  Pancreas:  No focal abnormality seen.  Spleen:  Measures 6.8 cm, negative.  Right Kidney:  Measures 12.4 cm.  Parenchymal echogenicity is normal.  No hydronephrosis.  No focal lesions.  Left Kidney:  Measures 12.3 cm.  Parenchymal echogenicity is normal.  No hydronephrosis.  No focal lesions.  Abdominal aorta:  No aneurysm identified.  IMPRESSION:  1. Granular cholelithiasis with slight wall thickening.  Absent sonographic Murphy's sign.  Findings can be seen with chronic cholecystitis. 2.  Hepatic steatosis.   Original Report Authenticated By: Reyes Ivan, M.D.    Ct Abdomen Pelvis W Contrast  02/23/2012  *RADIOLOGY REPORT*  Clinical Data: Abdominal pain and flank pain.  Nausea and vomiting.  CT ABDOMEN AND PELVIS WITH CONTRAST  Technique:  Multidetector CT imaging of the abdomen and pelvis was performed following the standard protocol during bolus administration of intravenous contrast.  Contrast: OMNIPAQUE IOHEXOL 300 MG/ML  SOLN  Comparison: Ultrasound abdomen  02/23/2012.  Findings: Lung bases show dependent atelectasis and/or scarring. Heart size within normal limits.  No pericardial or pleural effusion.  Small hiatal hernia.  Liver, gallbladder, adrenal glands, kidneys and spleen are unremarkable.  The pancreas is hazy in appearance with surrounding inflammatory stranding and fluid.  The gland is uniform in  attenuation, without areas of necrosis or organized fluid.  Stomach and bowel are unremarkable.  No pathologically enlarged lymph nodes.  No free fluid.  No worrisome lytic or sclerotic lesions.  IMPRESSION: Acute pancreatitis without complicating feature.   Original Report Authenticated By: Reyes Ivan, M.D.    Dg Chest Port 1 View  02/23/2012  *RADIOLOGY REPORT*  Clinical Data: Epigastric pain radiating to back, nausea, vomiting  PORTABLE CHEST - 2 VIEW  Comparison: Portable exam 0959 hours compared to 09/16/2028  Findings: Enlargement of cardiac silhouette. Slight pulmonary vascular congestion. Mediastinal contours normal. Bronchitic changes with right basilar atelectasis. Lungs otherwise clear. No pleural effusion or pneumothorax. Bones unremarkable.  IMPRESSION: Mild enlargement of cardiac silhouette with slight pulmonary vascular congestion. Bronchitic changes with right basilar atelectasis.   Original Report Authenticated By: Lollie Marrow, M.D.       Medications:    Infusions:    . sodium chloride 1,000 mL (02/24/12 1123)     Scheduled Medications:    . enoxaparin (LOVENOX) injection  40 mg Subcutaneous Q24H  . fentaNYL  50 mcg Intravenous Once  . ketorolac  30 mg Intravenous Q6H  . loratadine  10 mg Oral Daily  . montelukast  10 mg Oral Daily  .  morphine injection  1 mg Intravenous Once  . ondansetron (ZOFRAN) IV  4 mg Intravenous Once  . pantoprazole  40 mg Oral Daily  . senna-docusate  1 tablet Oral BID  . sodium chloride  3 mL Intravenous Q12H  . DISCONTD: acetaminophen  1,000 mg Intravenous Q6H  . DISCONTD:  morphine  injection  1 mg Intravenous Once     PRN Medications: acetaminophen, albuterol, HYDROmorphone (DILAUDID) injection, iohexol, ondansetron (ZOFRAN) IV, ondansetron, DISCONTD:  morphine injection, DISCONTD:  morphine injection    Assessment/ Plan:    Ms. Mary Daniels is a pleasant 56 yo woman with a PMHx significant for morbid obesity, asthma, and surgically repaired Schatzki's ring on hospital day #1 with new diagnoses of acute pancreatitis and chronic cholecystitis strongly supported by history, physical, elevated pancreatic enzymes and ultrasound findings.  1.  Acute pancreatitis: -Lipase has dropped to 1290 since admission but is still elevated,  Serial clinical exams to monitor abdominal pain, once pain is under control will change from NPO to clear liquids, if tolerated will continue to progress until normal diet.  Once pt is tolerating a normal diet and pain has resolved will consult surgery for possible cholecystectomy -Repeat lipase tomorrow at 5 AM -Hydromorphone to 1 mg intravenously every 2 hours as needed -Continue ketorolac, and tylenol for analgesia -Zofran for nausea -NPO until pain is under control 2.  Chronic Cholecystitis: -Given ultrasound results (cholelithiasis and wall thickening) cholecystitis is a possible cause of this patient's pancreatitis.  Cholecystectomy is planned once pancreatitis has resolved. 3.  Asthma/seasonal Allergies: -Stable.  Continue home medications of albuterol as needed, monteleukast, and loratadine 4.  Morbid Obesity:   -No net weight change since admission, BMI 44 5.  Hypokalemia: -Potassium has remained stable at 3.4, given that she has no adverse symptoms (abnormal heart rhythms, muscle weakness, tingling/numbness) there is no need at this time to replace potassium. 6.  FEN/GI: -Maintenance fluids given NPO status @ 125 mL/hr with NS 7.  Dispo: -Should remain internal medicine inpatient until pancreatitis has resolved -Once pancreatitis has  resolved will consult surgery      Length of Stay: 1 days    This is a Psychologist, occupational Note.  The care of the patient was  discussed with Dr. Earlene Plater and the assessment and plan formulated with their assistance.  Please see their attached note or addendum for official documentation of the daily encounter.  Addendum to medical student daily progress note:  I have seen the patient and reviewed the daily progress note by Frederik Pear and discussed the care of the patient with him. See below for documentation of my findings, assessment, and plans.   Subjective:    Interval Events:  I agree with above documentation.  Pain persists.    Objective:    Vital Signs:  Above vital signs were reviewed.   Physical Exam: ENERAL:  alert and oriented; resting in chair in discomfort LUNGS:  clear to auscultation bilaterally, normal work of breathing HEART:  normal rate; regular rhythm; normal S1 and S2, no S3 or S4 appreciated; no murmurs, rubs, or clicks ABDOMEN:  soft, diffusely tender with palpation, absent bowel sounds, no masses palpated    Labs:  Above labs and radiographic studies were reviewed.    Assessment/ Plan:    I agree with the above assessment and plan.  Additionally, there was a concern over hematuria.  Urinalysis was sent.  Radiology did not mention evidence of urolithiasis on the pre-contrast CT.    Length of Stay: 1 days   Signed by:  Dorthula Rue. Earlene Plater, MD PGY-I, Internal Medicine Pager [phone]  02/24/2012, 4:41 PM

## 2012-02-25 LAB — URINALYSIS, MICROSCOPIC ONLY
Glucose, UA: NEGATIVE mg/dL
Hgb urine dipstick: NEGATIVE
Leukocytes, UA: NEGATIVE
Specific Gravity, Urine: 1.035 — ABNORMAL HIGH (ref 1.005–1.030)
pH: 6 (ref 5.0–8.0)

## 2012-02-25 LAB — BASIC METABOLIC PANEL
BUN: 13 mg/dL (ref 6–23)
Chloride: 107 mEq/L (ref 96–112)
Creatinine, Ser: 0.69 mg/dL (ref 0.50–1.10)
Glucose, Bld: 79 mg/dL (ref 70–99)
Potassium: 3.2 mEq/L — ABNORMAL LOW (ref 3.5–5.1)

## 2012-02-25 MED ORDER — CIPROFLOXACIN-DEXAMETHASONE 0.3-0.1 % OT SUSP
4.0000 [drp] | Freq: Two times a day (BID) | OTIC | Status: DC
  Administered 2012-02-25 – 2012-02-28 (×6): 4 [drp] via OTIC
  Filled 2012-02-25: qty 7.5

## 2012-02-25 MED ORDER — KCL IN DEXTROSE-NACL 20-5-0.45 MEQ/L-%-% IV SOLN
INTRAVENOUS | Status: DC
  Administered 2012-02-25: 14:00:00 via INTRAVENOUS
  Administered 2012-02-26: 1000 mL via INTRAVENOUS
  Administered 2012-02-26: 01:00:00 via INTRAVENOUS
  Filled 2012-02-25 (×6): qty 1000

## 2012-02-25 MED ORDER — DIPHENHYDRAMINE HCL 25 MG PO CAPS
25.0000 mg | ORAL_CAPSULE | Freq: Four times a day (QID) | ORAL | Status: DC | PRN
  Administered 2012-02-25 – 2012-02-26 (×3): 25 mg via ORAL
  Filled 2012-02-25 (×4): qty 1

## 2012-02-25 MED ORDER — DEXTROSE 5 % IV SOLN
1.0000 g | INTRAVENOUS | Status: AC
  Administered 2012-02-25 – 2012-02-27 (×3): 1 g via INTRAVENOUS
  Filled 2012-02-25 (×3): qty 10

## 2012-02-25 NOTE — Progress Notes (Signed)
Internal Medicine Teaching Service Attending Note Date: 02/25/2012  Patient name: Mary Daniels  Medical record number: [id number]  Date of birth: [birth year]   I have seen and evaluated Mary Daniels and discussed their care with the Residency Team.   Complains of back pain.     . ciprofloxacin-dexamethasone  4 drop Both Ears BID  . enoxaparin (LOVENOX) injection  40 mg Subcutaneous Q24H  . ketorolac  30 mg Intravenous Q6H  . loratadine  10 mg Oral Daily  . montelukast  10 mg Oral Daily  . pantoprazole  40 mg Oral Daily  . senna-docusate  1 tablet Oral BID  . sodium chloride  3 mL Intravenous Q12H     Physical Exam: Blood pressure 111/72, pulse 81, temperature 98.5 F (36.9 C), temperature source Oral, resp. rate 20, height 5\' 2"  (1.575 m), weight 115.4 kg (254 lb 6.6 oz), SpO2 92.00%. General appearance: alert, cooperative and mild distress Back: bilateral flank pain.  Resp: clear to auscultation bilaterally Cardio: regular rate and rhythm GI: abnormal findings:  hypoactive bowel sounds, obese and soft, no rebound or gaurding. improved pain on exam from previous Extremities: edema 3+ pre-tibial  Lab results: Results for orders placed during the hospital encounter of 02/23/12 (from the past 24 hour(s))  URINALYSIS, ROUTINE W REFLEX MICROSCOPIC     Status: Abnormal   Collection Time   02/24/12  1:30 PM      Component Value Range   Color, Urine AMBER (*) YELLOW   APPearance HAZY (*) CLEAR   Specific Gravity, Urine 1.033 (*) 1.005 - 1.030   pH 6.0  5.0 - 8.0   Glucose, UA NEGATIVE  NEGATIVE mg/dL   Hgb urine dipstick NEGATIVE  NEGATIVE   Bilirubin Urine SMALL (*) NEGATIVE   Ketones, ur 15 (*) NEGATIVE mg/dL   Protein, ur 30 (*) NEGATIVE mg/dL   Urobilinogen, UA 0.2  0.0 - 1.0 mg/dL   Nitrite NEGATIVE  NEGATIVE   Leukocytes, UA MODERATE (*) NEGATIVE  URINE MICROSCOPIC-ADD ON     Status: Abnormal   Collection Time   02/24/12  1:30 PM      Component Value Range   Squamous Epithelial / LPF RARE  RARE   WBC, UA 21-50  <3 WBC/hpf   RBC / HPF 0-2  <3 RBC/hpf   Bacteria, UA FEW (*) RARE  BASIC METABOLIC PANEL     Status: Abnormal   Collection Time   02/25/12  6:00 AM      Component Value Range   Sodium 143  135 - 145 mEq/L   Potassium 3.2 (*) 3.5 - 5.1 mEq/L   Chloride 107  96 - 112 mEq/L   CO2 26  19 - 32 mEq/L   Glucose, Bld 79  70 - 99 mg/dL   BUN 13  6 - 23 mg/dL   Creatinine, Ser 2.95  0.50 - 1.10 mg/dL   Calcium 8.6  8.4 - 28.4 mg/dL   GFR calc non Af Amer >90  >90 mL/min   GFR calc Af Amer >90  >90 mL/min  LIPASE, BLOOD     Status: Abnormal   Collection Time   02/25/12  6:00 AM      Component Value Range   Lipase 141 (*) 11 - 59 U/L  GLUCOSE, CAPILLARY     Status: Normal   Collection Time   02/25/12  7:45 AM      Component Value Range   Glucose-Capillary 80  70 - 99 mg/dL  Imaging results:  US Abdomen Complete  02/23/2012  *RADIOLOGY REPORT*  Clinical Data:  Abdominal and flank pain.  COMPLETE ABDOMINAL ULTRASOUND  Comparison:  None.  Findings:  Gallbladder:  Granular shadowing echogenic debris is seen in the gallbladder.  Wall measures 4 mm.  No sonographic Murphy's sign.  Common bile duct:  Measures 5 mm, within normal limits.  Liver:  Diffusely increased in echogenicity.  IVC:  Appears normal.  Pancreas:  No focal abnormality seen.  Spleen:  Measures 6.8 cm, negative.  Right Kidney:  Measures 12.4 cm.  Parenchymal echogenicity is normal.  No hydronephrosis.  No focal lesions.  Left Kidney:  Measures 12.3 cm.  Parenchymal echogenicity is normal.  No hydronephrosis.  No focal lesions.  Abdominal aorta:  No aneurysm identified.  IMPRESSION:  1. Granular cholelithiasis with slight wall thickening.  Absent sonographic Murphy's sign.  Findings can be seen with chronic cholecystitis. 2.  Hepatic steatosis.   Original Report Authenticated By: Reyes Ivan, M.D.    Ct Abdomen Pelvis W Contrast  02/23/2012  *RADIOLOGY REPORT*  Clinical  Data: Abdominal pain and flank pain.  Nausea and vomiting.  CT ABDOMEN AND PELVIS WITH CONTRAST  Technique:  Multidetector CT imaging of the abdomen and pelvis was performed following the standard protocol during bolus administration of intravenous contrast.  Contrast: OMNIPAQUE IOHEXOL 300 MG/ML  SOLN  Comparison: Ultrasound abdomen 02/23/2012.  Findings: Lung bases show dependent atelectasis and/or scarring. Heart size within normal limits.  No pericardial or pleural effusion.  Small hiatal hernia.  Liver, gallbladder, adrenal glands, kidneys and spleen are unremarkable.  The pancreas is hazy in appearance with surrounding inflammatory stranding and fluid.  The gland is uniform in attenuation, without areas of necrosis or organized fluid.  Stomach and bowel are unremarkable.  No pathologically enlarged lymph nodes.  No free fluid.  No worrisome lytic or sclerotic lesions.  IMPRESSION: Acute pancreatitis without complicating feature.   Original Report Authenticated By: Reyes Ivan, M.D.     Assessment and Plan: I agree with the formulated Assessment and Plan with the following changes:   Pancreatitis, acute, biliary Pyuria Back Pain Ear pain  Her lipase has improved dramatically.  appreciate surgery f/u. Hopefully for lap-chole tomorrow Will start ceftriaxone for her pyuria, send Cx and await results.  Start topical anbx for ear pain, ? Otitis externa Improve fluid status

## 2012-02-25 NOTE — Progress Notes (Signed)
Patient ID: Mary Daniels, female   DOB: 16-Apr-1956, 56 y.o.   MRN: [id number] Medical Student Daily Progress Note   Subjective:    Interval Events:  Overnight the patient became hypotensive (119/51 @ 2101 10/22) which responded to a liter bolus of NS and since she has been normotensive.  At the same time her O2 saturation had dropped to 92% on room air and so was given supplementary oxygen at 2 liters per hour by nasal canula.  Pt has remained afebrile since admission.  Today the patient continues to complain of diffuse abdominal tenderness with the worst pain located in the mid epigastric region.  Patient also complains of significant bilateral back pain below the costal margin which she describes as 8/10 that improves significantly with her current regimen of 1 mg hydromorphone every 2 hours prn.  Patient also mentioned left sided ear pain which she describes as throbbing and worse with touching/movement of the ear itself.  Patient denies bowel movement but endorses flatulence last night.  She endorses non-painful urination but reports that the urine is dark in color.  ROS otherwise negative.    Objective:    Vital Signs:   Temp:  [98.1 F (36.7 C)-98.7 F (37.1 C)] 98.5 F (36.9 C) (10/23 0530) Pulse Rate:  [80-92] 81  (10/23 0530) Resp:  [18-20] 20  (10/23 0530) BP: (111-124)/(51-82) 111/72 mmHg (10/23 0530) SpO2:  [92 %-95 %] 92 % (10/23 0530) Weight:  [115.4 kg (254 lb 6.6 oz)] 115.4 kg (254 lb 6.6 oz) (10/23 0530) Last BM Date: 02/23/12   Weights: Weight at admission - 249 lb 5.4 oz (113.1 kg) 24-hour Weight change: 2.3 kg (5 lb 1.1 oz)  Filed Weights   02/23/12 1800 02/25/12 0530  Weight: 113.1 kg (249 lb 5.4 oz) 115.4 kg (254 lb 6.6 oz)   Net since admission:  +2.3 kg   Intake/Output:   Intake/Output Summary (Last 24 hours) at 02/25/12 1031 Last data filed at 02/25/12 0313  Gross per 24 hour  Intake 1979.17 ml  Output    600 ml  Net 1379.17 ml     Net since  admission:  1829.17   Physical Exam: GENERAL:  alert and oriented; resting uncomfortably in chair leaning forward to relieve abd and back pain. EYES:  pupils equal (2-3 mm bilaterally), round, and reactive to light; sclera anicteric ENT:  Bilateral erythema of ear canals, pain with movement of the left pinna more than the right, oropharynx is clean and non-erythematous, moist mucosa NECK:  no JVD, anterior cervical lymph node tenderness BACK/LUNGS:  Bilateral back tenderness below the costal margin worse with palpation, lungs clear to auscultation bilaterally, normal work of breathing HEART:  normal rate; regular rhythm; normal S1 and S2, no S3 or S4 appreciated; no murmurs, rubs, or clicks ABDOMEN:  soft, diffuse tenderness to deep palpation (after dose of hydromorphone) worse in mid-epigastric region, decreased bowel sounds, no masses palpated EXTREMITIES:  1+ edema in left lower extremity MSK:  5/5 strength in upper and lower extremities SKIN:  No sign of rash or ecchymoses    Labs: Basic Metabolic Panel:  Lab 02/25/12 4098 02/24/12 0625 02/23/12 0946 02/23/12 0932  NA 143 138 142 139  K 3.2* 3.4* 3.4* 3.4*  CL 107 103 101 102  CO2 26 28 -- 28  GLUCOSE 79 103* 133* 137*  BUN 13 11 16 15   CREATININE 0.69 0.69 1.00 0.78  CALCIUM 8.6 8.6 -- 9.5  MG -- -- -- 1.9  PHOS -- -- -- --  Liver Function Tests:  Lab 02/24/12 0625 02/23/12 0932  AST 102* 309*  ALT 159* 210*  ALKPHOS 91 98  BILITOT 0.6 0.6  PROT 6.5 7.5  ALBUMIN 3.1* 3.6    Lab 02/25/12 0600 02/24/12 0625 02/23/12 0932  LIPASE 141* 1290* >3000*  AMYLASE -- -- --   CBC:  Lab 02/24/12 0625 02/23/12 0946 02/23/12 0932  WBC 12.2* -- 16.0*  NEUTROABS -- -- 11.6*  HGB 12.3 14.6 13.9  HCT 37.1 43.0 41.8  MCV 88.1 -- 87.6  PLT 230 -- 274    CBG:  Lab 02/25/12 0745 02/24/12 0749  GLUCAP 80 99    Microbiology: Results for orders placed during the hospital encounter of 02/23/12  CULTURE, BLOOD (ROUTINE X  2)     Status: Normal (Preliminary result)   Collection Time   02/23/12 10:35 AM      Component Value Range Status Comment   Specimen Description BLOOD HAND LEFT   Final    Special Requests BOTTLES DRAWN AEROBIC ONLY 3CC   Final    Culture  Setup Time 02/23/2012 18:24   Final    Culture     Final    Value:        BLOOD CULTURE RECEIVED NO GROWTH TO DATE CULTURE WILL BE HELD FOR 5 DAYS BEFORE ISSUING A FINAL NEGATIVE REPORT   Report Status PENDING   Incomplete   CULTURE, BLOOD (ROUTINE X 2)     Status: Normal (Preliminary result)   Collection Time   02/23/12 10:40 AM      Component Value Range Status Comment   Specimen Description BLOOD HAND RIGHT   Final    Special Requests BOTTLES DRAWN AEROBIC ONLY 5CC   Final    Culture  Setup Time 02/23/2012 18:24   Final    Culture     Final    Value:        BLOOD CULTURE RECEIVED NO GROWTH TO DATE CULTURE WILL BE HELD FOR 5 DAYS BEFORE ISSUING A FINAL NEGATIVE REPORT   Report Status PENDING   Incomplete   URINE CULTURE     Status: Normal   Collection Time   02/23/12 11:10 AM      Component Value Range Status Comment   Specimen Description URINE, RANDOM   Final    Special Requests NONE   Final    Culture  Setup Time 02/23/2012 11:40   Final    Colony Count 8,000 COLONIES/ML   Final    Culture INSIGNIFICANT GROWTH   Final    Report Status 02/24/2012 FINAL   Final     Imaging: US Abdomen Complete  02/23/2012  *RADIOLOGY REPORT*  Clinical Data:  Abdominal and flank pain.  COMPLETE ABDOMINAL ULTRASOUND  Comparison:  None.  Findings:  Gallbladder:  Granular shadowing echogenic debris is seen in the gallbladder.  Wall measures 4 mm.  No sonographic Murphy's sign.  Common bile duct:  Measures 5 mm, within normal limits.  Liver:  Diffusely increased in echogenicity.  IVC:  Appears normal.  Pancreas:  No focal abnormality seen.  Spleen:  Measures 6.8 cm, negative.  Right Kidney:  Measures 12.4 cm.  Parenchymal echogenicity is normal.  No  hydronephrosis.  No focal lesions.  Left Kidney:  Measures 12.3 cm.  Parenchymal echogenicity is normal.  No hydronephrosis.  No focal lesions.  Abdominal aorta:  No aneurysm identified.  IMPRESSION:  1. Granular cholelithiasis with slight wall thickening.  Absent sonographic Murphy's sign.  Findings can be seen  with chronic cholecystitis. 2.  Hepatic steatosis.   Original Report Authenticated By: Reyes Ivan, M.D.    Ct Abdomen Pelvis W Contrast  02/23/2012  *RADIOLOGY REPORT*  Clinical Data: Abdominal pain and flank pain.  Nausea and vomiting.  CT ABDOMEN AND PELVIS WITH CONTRAST  Technique:  Multidetector CT imaging of the abdomen and pelvis was performed following the standard protocol during bolus administration of intravenous contrast.  Contrast: OMNIPAQUE IOHEXOL 300 MG/ML  SOLN  Comparison: Ultrasound abdomen 02/23/2012.  Findings: Lung bases show dependent atelectasis and/or scarring. Heart size within normal limits.  No pericardial or pleural effusion.  Small hiatal hernia.  Liver, gallbladder, adrenal glands, kidneys and spleen are unremarkable.  The pancreas is hazy in appearance with surrounding inflammatory stranding and fluid.  The gland is uniform in attenuation, without areas of necrosis or organized fluid.  Stomach and bowel are unremarkable.  No pathologically enlarged lymph nodes.  No free fluid.  No worrisome lytic or sclerotic lesions.  IMPRESSION: Acute pancreatitis without complicating feature.   Original Report Authenticated By: Reyes Ivan, M.D.       Medications:    Infusions:    . sodium chloride 1,000 mL (02/25/12 0243)     Scheduled Medications:    . cefTRIAXone (ROCEPHIN)  IV  1 g Intravenous Q24H  . ciprofloxacin-dexamethasone  4 drop Both Ears BID  . enoxaparin (LOVENOX) injection  40 mg Subcutaneous Q24H  . ketorolac  30 mg Intravenous Q6H  . loratadine  10 mg Oral Daily  . montelukast  10 mg Oral Daily  . pantoprazole  40 mg Oral Daily    . senna-docusate  1 tablet Oral BID  . sodium chloride  3 mL Intravenous Q12H     PRN Medications: acetaminophen, albuterol, HYDROmorphone (DILAUDID) injection, ondansetron (ZOFRAN) IV, ondansetron    Assessment/ Plan:    Ms. Pletz is a pleasant 56 yo woman with a PMHx significant for morbid obesity, asthma, and surgically repaired Schatzki's ring on hospital day #2 being treated for acute pancreatitis and chronic cholecystitis (dx's strongly supported by history, physical, elevated pancreatic enzymes and ultrasound findings).   1. Acute pancreatitis:  -Lipase has dropped to 142 since admission but is still elevated, serial clinical exams to monitor abdominal pain, once pain is under control will change from NPO to clear liquids, if tolerated will continue to progress until normal diet. Once pt is tolerating a normal diet and pain has resolved will consult surgery for cholecystectomy  - -Hydromorphone to 1 mg intravenously every 2 hours as needed  -Continue ketorolac, and tylenol for analgesia  -Zofran for nausea  -NPO until pain is under control   2. Chronic Cholecystitis:  -Given ultrasound results (cholelithiasis and wall thickening) cholecystitis is a possible cause of this patient's pancreatitis. Cholecystectomy is planned once pancreatitis has resolved.  3. Back Pain: -Could be attributed to pancreatitis or UTI.  Urinalysis showed moderate leukocytes and a few bacteria supporting UTI however the specimen was not a clean catch (urine was sitting in a hat for an undetermined amount of time before being transferred to a specimen cup).  Begin empiric treatment for UTI with ceftriaxone and repeat urinalysis with proper specimen collection technique to confirm.    3. Hypotension: -Likely due to hypovolemia given her response to a 1L bolus of NS.  Continue to monitor BP q8 hours.  4. Hypoxia: -Patient has a history of apneic episodes with narcotics (noticed in the ED at admission).   Will continue current  pain regimen d/t patient's report of improvement and continue her on 2 L/hr O2 via nasal canula.  Continue to monitor O2 sats with regular vital signs and adjust O2 accordingly.  5. Ear pain: -Physical exam showing erythema of external ear canals and pain with movement of the pinna makes otitis externa likely.   -Begin bilateral ciprofloxacin/dexamethasone eardrops.    6. Asthma/seasonal Allergies:  -Stable. Continue home medications of albuterol as needed, monteleukast, and loratadine   7. Morbid Obesity:  -Weight gain of 2.3 kg since admission this is likely due to fluid retention secondary to pancreatitis.  Likely to resolve once pancreatitis has resolved. BMI 44   8. Hypokalemia:  -Potassium has decreased to 3.2 since admission likely due to decreased intake and GI losses.  Plan to replace with maintenance fluids D51/2 NS + KCl @ 75 mL/hr  8. FEN/GI:  -NPO -Given her recent weight gain and edema on exam will plan to reduce maintenance fluids given D51/2 NS + KCl @ 75 mL/hr  9. Activity: -Up with assistance  10. Dispo:  -Should remain internal medicine inpatient until pancreatitis has resolved  -Once pancreatitis has resolved will consult surgery          Length of Stay: 2 days   This is a Psychologist, occupational Note.  The care of the patient was discussed with Dr. Earlene Plater and the assessment and plan formulated with their assistance.  Please see their attached note or addendum for official documentation of the daily encounter.    Addendum to medical student daily progress note:  I have seen the patient and reviewed the daily progress note by Frederik Pear and discussed the care of the patient with him. See below for documentation of my findings, assessment, and plans.   Subjective:    Interval Events:  I agree with above documentation.  Pain persists.    Objective:    Vital Signs:  Above vital signs were reviewed.   Physical Exam: GENERAL:  alert  and oriented; resting comfortably in bed and in mild distress LUNGS:  clear to auscultation bilaterally, normal work of breathing HEART:  normal rate; regular rhythm; normal S1 and S2, no S3 or S4 appreciated; no murmurs, rubs, or clicks ABDOMEN:  Soft, diffusely tender with palpation, especially in epigastrium, normal bowel sounds, no masses palpated EXTREMITIES:  1+ edema SKIN:  normal turgor     Labs:  Above labs and radiographic studies were reviewed.    Assessment/ Plan:    I agree with the above assessment and plan.  1.  Hypotension:  May be from narcotic use.  She is quite sensitive to these.  2.   Hypoxia:  Do not believe this is from central hypoventilation secondary to narcotics.  This may be from some fluid overload.   She is up about 3kg since admission.  I do not believe she needs diuresis at this point.  We will transition to 1/2 normal saline and decrease to 75cc/hr, well below maintenance.  3.  Hypokalemia:  Will replace with potassium added to IVF.  4.   Pyelonephritis.  5.   Otitis externa.    Length of Stay: 2 days   Signed by:  Dorthula Rue. Earlene Plater, MD PGY-I, Internal Medicine Pager [phone]  02/25/2012, 12:19 PM

## 2012-02-25 NOTE — Progress Notes (Signed)
Acute pancreatitis  Assessment: Acute pancreatitis Biliary pancreatitis, improving by lipase, but still with pain  Plan: If her lipase continues to improve, believe we should do lap chole tomorrow. Discussed with her and her husband. Likely the back pain is from the pancreatitis and will take more time to improve.  I have discussed the indications for laparoscopic cholecystectomy with her and provided educational material. We have discussed the risks of surgery, including general risks such as bleeding, infection, lung and heart issues etc. We have also discussed the potential for injuries to other organs, bile duct leaks, and other unexpected events. We have also talked about the fact that this may need to be converted to open under certain circumstances. We discussed the typical post op recovery and the fact that there is a good likelihood of improvement in symptoms and return to normal activity.  She understands this and wishes to proceed to schedule surgery. I believe all of her questions have been answered.      Subjective: Still with back pain, nausea better.  Objective: Vital signs in last 24 hours: Temp:  [98.1 F (36.7 C)-98.7 F (37.1 C)] 98.5 F (36.9 C) (10/23 0530) Pulse Rate:  [80-92] 81  (10/23 0530) Resp:  [18-20] 20  (10/23 0530) BP: (111-124)/(51-82) 111/72 mmHg (10/23 0530) SpO2:  [92 %-95 %] 92 % (10/23 0530) Weight:  [254 lb 6.6 oz (115.4 kg)] 254 lb 6.6 oz (115.4 kg) (10/23 0530) Last BM Date: 02/23/12  Intake/Output from previous day: 10/22 0701 - 10/23 0700 In: 1979.2 [I.V.:1979.2] Out: 600 [Urine:600] Intake/Output this shift:    General appearance: alert, cooperative and mild distress GI: Mild epigastric tender, no rebound  Lab Results:  Results for orders placed during the hospital encounter of 02/23/12 (from the past 24 hour(s))  URINALYSIS, ROUTINE W REFLEX MICROSCOPIC     Status: Abnormal   Collection Time   02/24/12  1:30 PM   Component Value Range   Color, Urine AMBER (*) YELLOW   APPearance HAZY (*) CLEAR   Specific Gravity, Urine 1.033 (*) 1.005 - 1.030   pH 6.0  5.0 - 8.0   Glucose, UA NEGATIVE  NEGATIVE mg/dL   Hgb urine dipstick NEGATIVE  NEGATIVE   Bilirubin Urine SMALL (*) NEGATIVE   Ketones, ur 15 (*) NEGATIVE mg/dL   Protein, ur 30 (*) NEGATIVE mg/dL   Urobilinogen, UA 0.2  0.0 - 1.0 mg/dL   Nitrite NEGATIVE  NEGATIVE   Leukocytes, UA MODERATE (*) NEGATIVE  URINE MICROSCOPIC-ADD ON     Status: Abnormal   Collection Time   02/24/12  1:30 PM      Component Value Range   Squamous Epithelial / LPF RARE  RARE   WBC, UA 21-50  <3 WBC/hpf   RBC / HPF 0-2  <3 RBC/hpf   Bacteria, UA FEW (*) RARE  BASIC METABOLIC PANEL     Status: Abnormal   Collection Time   02/25/12  6:00 AM      Component Value Range   Sodium 143  135 - 145 mEq/L   Potassium 3.2 (*) 3.5 - 5.1 mEq/L   Chloride 107  96 - 112 mEq/L   CO2 26  19 - 32 mEq/L   Glucose, Bld 79  70 - 99 mg/dL   BUN 13  6 - 23 mg/dL   Creatinine, Ser 1.61  0.50 - 1.10 mg/dL   Calcium 8.6  8.4 - 09.6 mg/dL   GFR calc non Af Amer >90  >90 mL/min  GFR calc Af Amer >90  >90 mL/min  LIPASE, BLOOD     Status: Abnormal   Collection Time   02/25/12  6:00 AM      Component Value Range   Lipase 141 (*) 11 - 59 U/L  GLUCOSE, CAPILLARY     Status: Normal   Collection Time   02/25/12  7:45 AM      Component Value Range   Glucose-Capillary 80  70 - 99 mg/dL     Studies/Results Radiology     MEDS, Scheduled    . enoxaparin (LOVENOX) injection  40 mg Subcutaneous Q24H  . ketorolac  30 mg Intravenous Q6H  . loratadine  10 mg Oral Daily  . montelukast  10 mg Oral Daily  . pantoprazole  40 mg Oral Daily  . senna-docusate  1 tablet Oral BID  . sodium chloride  3 mL Intravenous Q12H  . DISCONTD: acetaminophen  1,000 mg Intravenous Q6H  . DISCONTD:  morphine injection  1 mg Intravenous Once       LOS: 2 days    Currie Paris, MD,  [facility], [place] [phone]   02/25/2012 9:00 AM

## 2012-02-26 ENCOUNTER — Inpatient Hospital Stay (HOSPITAL_COMMUNITY): Payer: BC Managed Care – PPO

## 2012-02-26 LAB — BASIC METABOLIC PANEL
BUN: 13 mg/dL (ref 6–23)
Calcium: 9.2 mg/dL (ref 8.4–10.5)
Creatinine, Ser: 0.68 mg/dL (ref 0.50–1.10)
GFR calc Af Amer: >90 mL/min (ref >90)
GFR calc non Af Amer: >90 mL/min (ref >90)

## 2012-02-26 LAB — URINE CULTURE: Colony Count: 35000

## 2012-02-26 MED ORDER — MORPHINE SULFATE 2 MG/ML IJ SOLN
2.0000 mg | INTRAMUSCULAR | Status: DC | PRN
  Administered 2012-02-26: 2 mg via INTRAVENOUS
  Filled 2012-02-26 (×2): qty 1

## 2012-02-26 MED ORDER — HYDROMORPHONE HCL PF 1 MG/ML IJ SOLN
1.0000 mg | INTRAMUSCULAR | Status: DC | PRN
  Administered 2012-02-26 – 2012-02-28 (×11): 1 mg via INTRAVENOUS
  Filled 2012-02-26 (×12): qty 1

## 2012-02-26 MED ORDER — MORPHINE SULFATE 2 MG/ML IJ SOLN
2.0000 mg | Freq: Once | INTRAMUSCULAR | Status: AC
  Administered 2012-02-26: 2 mg via INTRAVENOUS

## 2012-02-26 NOTE — Progress Notes (Signed)
Internal Medicine Teaching Service Attending Note Date: 02/26/2012  Patient name: Mary Daniels  Medical record number: [id number]  Date of birth: 1955/06/13   I have seen and evaluated Mary Daniels and discussed their care with the Residency Team.   Resting quietly, awakens easily. No distress.     . cefTRIAXone (ROCEPHIN)  IV  1 g Intravenous Q24H  . ciprofloxacin-dexamethasone  4 drop Both Ears BID  . enoxaparin (LOVENOX) injection  40 mg Subcutaneous Q24H  . ketorolac  30 mg Intravenous Q6H  . loratadine  10 mg Oral Daily  . montelukast  10 mg Oral Daily  . pantoprazole  40 mg Oral Daily  . senna-docusate  1 tablet Oral BID  . sodium chloride  3 mL Intravenous Q12H    Physical Exam: Blood pressure 124/75, pulse 70, temperature 97.8 F (36.6 C), temperature source Oral, resp. rate 18, height 5\' 2"  (1.575 m), weight 116.4 kg (256 lb 9.9 oz), SpO2 95.00%. General appearance: alert, cooperative and no distress Resp: clear to auscultation bilaterally Cardio: regular rate and rhythm GI: normal findings: bowel sounds normal and soft, non-tender  Lab results: Results for orders placed during the hospital encounter of 02/23/12 (from the past 24 hour(s))  SURGICAL PCR SCREEN     Status: Normal   Collection Time   02/26/12  2:25 AM      Component Value Range   MRSA, PCR NEGATIVE  NEGATIVE   Staphylococcus aureus NEGATIVE  NEGATIVE  BASIC METABOLIC PANEL     Status: Normal   Collection Time   02/26/12  5:45 AM      Component Value Range   Sodium 140  135 - 145 mEq/L   Potassium 3.5  3.5 - 5.1 mEq/L   Chloride 104  96 - 112 mEq/L   CO2 29  19 - 32 mEq/L   Glucose, Bld 94  70 - 99 mg/dL   BUN 13  6 - 23 mg/dL   Creatinine, Ser 1.32  0.50 - 1.10 mg/dL   Calcium 9.2  8.4 - 44.0 mg/dL   GFR calc non Af Amer >90  >90 mL/min   GFR calc Af Amer >90  >90 mL/min  LIPASE, BLOOD     Status: Abnormal   Collection Time   02/26/12  5:45 AM      Component Value Range   Lipase 75  (*) 11 - 59 U/L  GLUCOSE, CAPILLARY     Status: Normal   Collection Time   02/26/12  7:51 AM      Component Value Range   Glucose-Capillary 95  70 - 99 mg/dL    Imaging results:  Dg Chest 2 View  02/26/2012  *RADIOLOGY REPORT*  Clinical Data: Productive cough.  CHEST - 2 VIEW  Comparison: Portable chest 02/23/2012 and PA and lateral chest 05/08/2009.  Findings: Blunting of the costophrenic angles is compatible with the presence of very small effusions.  No consolidative process, pneumothorax or effusion is identified.  Heart size is mildly enlarged.  IMPRESSION: Very small bilateral pleural effusions.  No consolidative process is identified.   Original Report Authenticated By: Bernadene Bell. Mary Daniels, M.D.     Assessment and Plan: I agree with the formulated Assessment and Plan with the following changes:  Pancreatitis, Acute Cholecystitis Pyuria Otitis Externa  Awaiting lap-chole. Her pain is significantly better. Ceftriaxone for now for her pyuria (UCx 35k mixed) Topicals for otitis.

## 2012-02-26 NOTE — Progress Notes (Signed)
Patient ID: Mary Daniels, female   DOB: 08/09/1955, 56 y.o.   MRN: [id number] Medical Student Daily Progress Note   Subjective:    Interval Events:   Yesterday afternoon, shortly after starting ceftriaxone pt complained of all over itching, Benadryl prn was ordered and since her itching has resolved.  Also during the exam to evaluate itching, a scaling partially scabbed rash was noted behind the left ear which she said was present before admission.  No acute events overnight.  Today, Mrs. Herzig reports that her back pain is significantly better, her urine looks normal again, her ear pain is better since getting the drops, and her pain continues to be well controlled on her current regimen reporting a 3/10 abdominal pain 1-2 hours after receiving hydromorphone.  Pain is still worst in the mid-epigastric region.  Patient is still urinating without pain or discomfort and denies BM and flatulence today.  New complaint today of facial pain and coughing up a thick, sometimes green, mucus that is occasionally streaked with blood, she reports that she started having nasal congestion the day before being admitted to the hospital and was doing a lot of yard work prior to admission.  Pt denies fever, chills, HA, dizziness, nausea, vomiting, and pain other than stated in HPI.    Objective:    Vital Signs:   Temp:  [98.2 F (36.8 C)-98.9 F (37.2 C)] 98.9 F (37.2 C) (10/24 0538) Pulse Rate:  [71-78] 78  (10/23 2137) Resp:  [14-20] 15  (10/24 0538) BP: (106-127)/(52-65) 127/52 mmHg (10/24 0538) SpO2:  [91 %-98 %] 98 % (10/24 0538) Weight:  [116.4 kg (256 lb 9.9 oz)] 116.4 kg (256 lb 9.9 oz) (10/24 0538) Last BM Date: 02/22/12   Weights: 24-hour Weight change: 1 kg (2 lb 3.3 oz)  Filed Weights   02/23/12 1800 02/25/12 0530 02/26/12 0538  Weight: 113.1 kg (249 lb 5.4 oz) 115.4 kg (254 lb 6.6 oz) 116.4 kg (256 lb 9.9 oz)   Net since admission:  +3.3 kg   Intake/Output:   Intake/Output  Summary (Last 24 hours) at 02/26/12 0942 Last data filed at 02/26/12 0917  Gross per 24 hour  Intake 939.25 ml  Output      0 ml  Net 939.25 ml     Net since admission:  +2943.42 mL   Physical Exam: GENERAL:  alert and oriented; resting comfortably in chair and in no distress EYES:  pupils equal, round, and reactive to light; sclera anicteric HENT:  NCAT, erythematous scaly patch w/ ~ 4mm eschar located behind left ear starts at crease and ends at hair line, moist mucosa, ear canals less erythematous than yesterday, TM's gray with intact light reflex, no pain with movement of pinna, dried blood and nasal crusting in nares bilaterally, throat non-erythematous NECK:  tender anterior cervical lymph nodes to palpation bilaterally LUNGS:  clear to auscultation bilaterally, normal work of breathing, no crackles or wheezes, productive cough (pt produced yellow sputum with spot of blood during exam) HEART:  normal rate; regular rhythm; normal S1 and S2, no S3 or S4 appreciated; no murmurs, rubs, or clicks ABDOMEN:  soft, diffusely tender to deep palpation worst in mid epigastric region, decreased but present bowel sounds, no masses palpated EXTREMITIES:  1+ edema NEURO: CN's grossly intact, normal strength able to rise from chair without using hands SKIN:  normal turgor    Labs: Basic Metabolic Panel:  Lab 02/26/12 7829 02/25/12 0600 02/24/12 0625 02/23/12 0946 02/23/12 0932  NA 140  143 138 142 139  K 3.5 3.2* 3.4* 3.4* 3.4*  CL 104 107 103 101 102  CO2 29 26 28  -- 28  GLUCOSE 94 79 103* 133* 137*  BUN 13 13 11 16 15   CREATININE 0.68 0.69 0.69 1.00 0.78  CALCIUM 9.2 8.6 8.6 -- --  MG -- -- -- -- 1.9  PHOS -- -- -- -- --    Liver Function Tests:  Lab 02/24/12 0625 02/23/12 0932  AST 102* 309*  ALT 159* 210*  ALKPHOS 91 98  BILITOT 0.6 0.6  PROT 6.5 7.5  ALBUMIN 3.1* 3.6    Lab 02/26/12 0545 02/25/12 0600 02/24/12 0625 02/23/12 0932  LIPASE 75* 141* 1290* >3000*  AMYLASE --  -- -- --   CBC:  Lab 02/24/12 0625 02/23/12 0946 02/23/12 0932  WBC 12.2* -- 16.0*  NEUTROABS -- -- 11.6*  HGB 12.3 14.6 13.9  HCT 37.1 43.0 41.8  MCV 88.1 -- 87.6  PLT 230 -- 274   CBG:  Lab 02/26/12 0751 02/25/12 0745 02/24/12 0749  GLUCAP 95 80 99   Microbiology: Results for orders placed during the hospital encounter of 02/23/12  CULTURE, BLOOD (ROUTINE X 2)     Status: Normal (Preliminary result)   Collection Time   02/23/12 10:35 AM      Component Value Range Status Comment   Specimen Description BLOOD HAND LEFT   Final    Special Requests BOTTLES DRAWN AEROBIC ONLY 3CC   Final    Culture  Setup Time 02/23/2012 18:24   Final    Culture     Final    Value:        BLOOD CULTURE RECEIVED NO GROWTH TO DATE CULTURE WILL BE HELD FOR 5 DAYS BEFORE ISSUING A FINAL NEGATIVE REPORT   Report Status PENDING   Incomplete   CULTURE, BLOOD (ROUTINE X 2)     Status: Normal (Preliminary result)   Collection Time   02/23/12 10:40 AM      Component Value Range Status Comment   Specimen Description BLOOD HAND RIGHT   Final    Special Requests BOTTLES DRAWN AEROBIC ONLY 5CC   Final    Culture  Setup Time 02/23/2012 18:24   Final    Culture     Final    Value:        BLOOD CULTURE RECEIVED NO GROWTH TO DATE CULTURE WILL BE HELD FOR 5 DAYS BEFORE ISSUING A FINAL NEGATIVE REPORT   Report Status PENDING   Incomplete   URINE CULTURE     Status: Normal   Collection Time   02/23/12 11:10 AM      Component Value Range Status Comment   Specimen Description URINE, RANDOM   Final    Special Requests NONE   Final    Culture  Setup Time 02/23/2012 11:40   Final    Colony Count 8,000 COLONIES/ML   Final    Culture INSIGNIFICANT GROWTH   Final    Report Status 02/24/2012 FINAL   Final   SURGICAL PCR SCREEN     Status: Normal   Collection Time   02/26/12  2:25 AM      Component Value Range Status Comment   MRSA, PCR NEGATIVE  NEGATIVE Final    Staphylococcus aureus NEGATIVE  NEGATIVE Final       Medications:    Infusions:    . dextrose 5 % and 0.45 % NaCl with KCl 20 mEq/L 75 mL/hr at 02/26/12 0123  .  DISCONTD: sodium chloride 1,000 mL (02/25/12 0243)     Scheduled Medications:    . cefTRIAXone (ROCEPHIN)  IV  1 g Intravenous Q24H  . ciprofloxacin-dexamethasone  4 drop Both Ears BID  . enoxaparin (LOVENOX) injection  40 mg Subcutaneous Q24H  . ketorolac  30 mg Intravenous Q6H  . loratadine  10 mg Oral Daily  . montelukast  10 mg Oral Daily  . pantoprazole  40 mg Oral Daily  . senna-docusate  1 tablet Oral BID  . sodium chloride  3 mL Intravenous Q12H     PRN Medications: acetaminophen, albuterol, diphenhydrAMINE, HYDROmorphone (DILAUDID) injection, ondansetron (ZOFRAN) IV, ondansetron    Assessment/ Plan:    Ms. Shiffer is a pleasant 56 yo woman with a PMHx significant for morbid obesity, asthma, and surgically repaired Schatzki's ring on hospital day #3 being treated for acute pancreatitis and chronic cholecystitis (dx's strongly supported by history, physical, elevated pancreatic enzymes and ultrasound findings).  Pt is also on day 2 of 3 ceftriaxone for pyelonephritis and day 2 of 7 ciprofloxacin/dexamethasone for otitis externa.  1. Acute pancreatitis:  -Lipase has dropped to 75 since admission, serial clinical exams to monitor abdominal pain, once pain is under control and patient is medically stable surgery would like to proceed with cholecystectomy.  -Morphine 2-4 mg every 2 hours prn  -Continue ketorolac, and tylenol for analgesia  -Zofran for nausea  -NPO until pain is under control   2. Chronic Cholecystitis:  -Given ultrasound results (cholelithiasis and wall thickening) cholecystitis is a possible cause of this patient's pancreatitis. Cholecystectomy is planned once pancreatitis has resolved.   3. Pyelonephritis:  -Clean catch specimen showed many bacteria on U/A. Ceftriaxone tx seems to have resolved pt's back pain and per her report urine appears  normal as well.  Day 2 of 3 as of 1200 will finish ceftriaxone course.   4. Hypotension:  -Has resolved. Continue to monitor with vital signs q8 hours.   5. Hypoxia:  -Patient has a history of apneic episodes with narcotics (noticed in the ED at admission). Plan to change pain regimen to morphine and discontinue supplementary O2, continue to monitor O2 sats with regular vital signs and restart if patient desats.   6. Otitis Externa:  -Pain has improved since beginning ciprofloxacin/dexamethasone drops.  -Day 2 of 7 as of 1200 continue ciprofloxacin/dexamethasone eardrops.   7. Itching: -Began shortly after starting ceftriaxone making it the likely cause.  Has since resolved with Benadryl.  Will continue Benadryl prn.  8. Sinusitis: -Physical exam revealed frontal/maxillary sinus tenderness and patient complains of productive cough.  Likely inflammation from allergies or viral sinusitis but will get chest x ray to rule out pneumonia prior to cholecystectomy.  9. Rash: -Has not changed since discovery yesterday.  No signs of infection, does not have appearance of fungal skin infection, likely dry skin.  Pt counseled to not scratch given potential for infection.  No need for treatment at this time.  10. Asthma/seasonal Allergies:  -Stable. Continue home medications of albuterol as needed, monteleukast, and loratadine   11. Morbid Obesity:  -Weight gain of 3.3 kg since admission this is likely due to fluid retention secondary to pancreatitis and aggressive fluid resuscitation. Weight gain and net fluid intake match closely (3300 mg weight gain vs. 2943.42 mL net fluid intake).  We have cut fluids to 75 mL/hr well below maintenance.  Likely to resolve once pt is able to take PO and iv fluids are discontinued. BMI 44   12.  Hypokalemia:  -Potassium has corrected to 3.5 since admission. Continue maintenance fluids with D51/2 NS + KCl @ 75 mL/hr while patient is NPO  13. FEN/GI:  -Remain NPO  given the high probability that cholecystectomy will happen either today or tomorrow -Continue maintenance fluids D51/2 NS + KCl @ 75 mL/hr   14. Activity:  -Up with assistance   15. Dispo:  -Should remain internal medicine inpatient until pancreatitis has resolved  -Per surgeon's note surgery to proceed either today or tomorrow pending negative chest imaging. -Plan to dc home after recovery from surgery          Length of Stay: 3 days   This is a Psychologist, occupational Note.  The care of the patient was discussed with Dr. Earlene Plater and the assessment and plan formulated with their assistance.  Please see their attached note or addendum for official documentation of the daily encounter.   Addendum to medical student daily progress note:  I have seen the patient and reviewed the daily progress note by Frederik Pear and discussed the care of the patient with him. See below for documentation of my findings, assessment, and plans.   Subjective:    Interval Events:  I agree with above documentation.  Pain is improving.      Objective:    Vital Signs:  Above vital signs were reviewed.   Physical Exam: GENERAL:  alert and oriented; resting comfortably in bed and in no distress EYES:  pupils equal, round, and reactive to light; sclera anicteric ENT:  moist mucosa, oropharynx clear, nasal mucosa erythematous with dried blood bilaterally, tender frontal sinuses, non-tender maxillary sinuses NECK:  no JVD, tender cervical lymphadenopathy bilaterally LUNGS:  clear to auscultation bilaterally, no dullness to percussion, no egophony, normal work of breathing HEART:  normal rate; regular rhythm; normal S1 and S2, no S3 or S4 appreciated; no murmurs, rubs, or clicks ABDOMEN:  Soft; tender with palpation diffusely, pain is worse in epigastrium; normal bowel sounds; no masses palpated; no rigidity, rebound, or guarding MSK:  back is tender with palpation of the lumbar paraspinal muscules EXTREMITIES:   1+ edema SKIN:  normal turgor, scaling rash behind left ear     Labs:  Above labs and radiographic studies were reviewed.    Assessment/ Plan:    I agree with the above assessment and plan.  1. Pancreatitis:  Continue bowel rest and analgesia. Defer to surgery for operative intervention.  2. Pyelonephritis:  Continue ceftriaxone.  3. Sinusitis:  Patient may have a viral URI or allergic rhinosinusitis.  Do not suspect pneumonia or bronchitis.    Length of Stay: 3 days   Signed by:  Dorthula Rue. Earlene Plater, MD PGY-I, Internal Medicine Pager [phone]  02/26/2012, 2:04 PM

## 2012-02-26 NOTE — Progress Notes (Addendum)
Acute pancreatitis  Assessment: Acute pancreatitis Biliary pancreatitis, improving clinically ? Productive cough Pyuria -started on antibiotic Can't locate lipase yet from this am  Plan: Can do Lap chole if lipase normal, but need to be sure she ahs no pulmonary issue that would make anesthesia more risk. Might be able to do late today, else tomorrow morning Will review her situation later today to make decision.   Subjective: Feels better, less pain, not nausea; notes productive cough with some blood, thick some green.   Objective: Vital signs in last 24 hours: Temp:  [98.2 F (36.8 C)-98.9 F (37.2 C)] 98.9 F (37.2 C) (10/24 0538) Pulse Rate:  [71-78] 78  (10/23 2137) Resp:  [14-20] 15  (10/24 0538) BP: (106-127)/(52-65) 127/52 mmHg (10/24 0538) SpO2:  [91 %-98 %] 98 % (10/24 0538) Weight:  [256 lb 9.9 oz (116.4 kg)] 256 lb 9.9 oz (116.4 kg) (10/24 0538) Last BM Date: 02/22/12  Intake/Output from previous day: 10/23 0701 - 10/24 0700 In: 939.3 [I.V.:881.3; IV Piggyback:58] Out: -  Intake/Output this shift:    General appearance: alert and no distress  Lab Results:  Results for orders placed during the hospital encounter of 02/23/12 (from the past 24 hour(s))  URINALYSIS, MICROSCOPIC ONLY     Status: Abnormal   Collection Time   02/25/12 10:38 AM      Component Value Range   Color, Urine YELLOW  YELLOW   APPearance HAZY (*) CLEAR   Specific Gravity, Urine 1.035 (*) 1.005 - 1.030   pH 6.0  5.0 - 8.0   Glucose, UA NEGATIVE  NEGATIVE mg/dL   Hgb urine dipstick NEGATIVE  NEGATIVE   Bilirubin Urine SMALL (*) NEGATIVE   Ketones, ur >80 (*) NEGATIVE mg/dL   Protein, ur 696 (*) NEGATIVE mg/dL   Urobilinogen, UA 1.0  0.0 - 1.0 mg/dL   Nitrite NEGATIVE  NEGATIVE   Leukocytes, UA NEGATIVE  NEGATIVE   WBC, UA 7-10  <3 WBC/hpf   RBC / HPF 3-6  <3 RBC/hpf   Bacteria, UA MANY (*) RARE   Squamous Epithelial / LPF FEW (*) RARE  SURGICAL PCR SCREEN     Status: Normal   Collection Time   02/26/12  2:25 AM      Component Value Range   MRSA, PCR NEGATIVE  NEGATIVE   Staphylococcus aureus NEGATIVE  NEGATIVE  BASIC METABOLIC PANEL     Status: Normal   Collection Time   02/26/12  5:45 AM      Component Value Range   Sodium 140  135 - 145 mEq/L   Potassium 3.5  3.5 - 5.1 mEq/L   Chloride 104  96 - 112 mEq/L   CO2 29  19 - 32 mEq/L   Glucose, Bld 94  70 - 99 mg/dL   BUN 13  6 - 23 mg/dL   Creatinine, Ser 2.95  0.50 - 1.10 mg/dL   Calcium 9.2  8.4 - 28.4 mg/dL   GFR calc non Af Amer >90  >90 mL/min   GFR calc Af Amer >90  >90 mL/min  GLUCOSE, CAPILLARY     Status: Normal   Collection Time   02/26/12  7:51 AM      Component Value Range   Glucose-Capillary 95  70 - 99 mg/dL     Studies/Results Radiology     MEDS, Scheduled    . cefTRIAXone (ROCEPHIN)  IV  1 g Intravenous Q24H  . ciprofloxacin-dexamethasone  4 drop Both Ears BID  . enoxaparin (  LOVENOX) injection  40 mg Subcutaneous Q24H  . ketorolac  30 mg Intravenous Q6H  . loratadine  10 mg Oral Daily  . montelukast  10 mg Oral Daily  . pantoprazole  40 mg Oral Daily  . senna-docusate  1 tablet Oral BID  . sodium chloride  3 mL Intravenous Q12H       LOS: 3 days    Currie Paris, MD, [facility], [place] [phone]   02/26/2012 8:25 AM   ADDENDUM: Due to emergency, will have to delay OR til tomorrow.  CXR clear.  Will allow clear liquids this afternoon and NPO P MN.  Sandra Tellefsen E 3:21 PM

## 2012-02-27 ENCOUNTER — Encounter (HOSPITAL_COMMUNITY): Payer: Self-pay | Admitting: Anesthesiology

## 2012-02-27 ENCOUNTER — Inpatient Hospital Stay (HOSPITAL_COMMUNITY): Payer: BC Managed Care – PPO

## 2012-02-27 ENCOUNTER — Inpatient Hospital Stay (HOSPITAL_COMMUNITY): Payer: BC Managed Care – PPO | Admitting: Anesthesiology

## 2012-02-27 ENCOUNTER — Encounter (HOSPITAL_COMMUNITY): Admission: EM | Disposition: A | Discharge status: Home / Self Care | Payer: Self-pay | Source: Home / Self Care

## 2012-02-27 DIAGNOSIS — K8010 Calculus of gallbladder with chronic cholecystitis without obstruction: Secondary | ICD-10-CM

## 2012-02-27 HISTORY — PX: CHOLECYSTECTOMY: SHX55

## 2012-02-27 LAB — BASIC METABOLIC PANEL
BUN: 11 mg/dL (ref 6–23)
Creatinine, Ser: 0.7 mg/dL (ref 0.50–1.10)
GFR calc non Af Amer: >90 mL/min (ref >90)
Glucose, Bld: 94 mg/dL (ref 70–99)
Potassium: 3.3 mEq/L — ABNORMAL LOW (ref 3.5–5.1)

## 2012-02-27 LAB — CBC
HCT: 35 % — ABNORMAL LOW (ref 36.0–46.0)
Hemoglobin: 11 g/dL — ABNORMAL LOW (ref 12.0–15.0)
MCH: 28.4 pg (ref 26.0–34.0)
MCHC: 31.4 g/dL (ref 30.0–36.0)
MCV: 90.4 fL (ref 78.0–100.0)

## 2012-02-27 SURGERY — LAPAROSCOPIC CHOLECYSTECTOMY WITH INTRAOPERATIVE CHOLANGIOGRAM
Anesthesia: General | Site: Abdomen | Wound class: Clean Contaminated

## 2012-02-27 MED ORDER — FENTANYL CITRATE 0.05 MG/ML IJ SOLN
INTRAMUSCULAR | Status: DC | PRN
  Administered 2012-02-27: 100 ug via INTRAVENOUS

## 2012-02-27 MED ORDER — ONDANSETRON HCL 4 MG/2ML IJ SOLN
INTRAMUSCULAR | Status: DC | PRN
  Administered 2012-02-27: 4 mg via INTRAVENOUS

## 2012-02-27 MED ORDER — NEOSTIGMINE METHYLSULFATE 1 MG/ML IJ SOLN
INTRAMUSCULAR | Status: DC | PRN
  Administered 2012-02-27: 3 mg via INTRAVENOUS

## 2012-02-27 MED ORDER — DEXAMETHASONE SODIUM PHOSPHATE 4 MG/ML IJ SOLN
INTRAMUSCULAR | Status: DC | PRN
  Administered 2012-02-27: 8 mg via INTRAVENOUS

## 2012-02-27 MED ORDER — ROCURONIUM BROMIDE 100 MG/10ML IV SOLN
INTRAVENOUS | Status: DC | PRN
  Administered 2012-02-27: 50 mg via INTRAVENOUS

## 2012-02-27 MED ORDER — BUPIVACAINE HCL (PF) 0.25 % IJ SOLN
INTRAMUSCULAR | Status: DC | PRN
  Administered 2012-02-27: 28 mL

## 2012-02-27 MED ORDER — ENOXAPARIN SODIUM 40 MG/0.4ML ~~LOC~~ SOLN
40.0000 mg | SUBCUTANEOUS | Status: DC
  Administered 2012-02-28: 40 mg via SUBCUTANEOUS
  Filled 2012-02-27 (×2): qty 0.4

## 2012-02-27 MED ORDER — BUPIVACAINE HCL (PF) 0.25 % IJ SOLN
INTRAMUSCULAR | Status: AC
  Filled 2012-02-27: qty 30

## 2012-02-27 MED ORDER — METOCLOPRAMIDE HCL 5 MG/ML IJ SOLN: 10.0000 mg | Freq: Once | INTRAMUSCULAR | Status: DC | PRN

## 2012-02-27 MED ORDER — SODIUM CHLORIDE 0.9 % IR SOLN
Status: DC | PRN
  Administered 2012-02-27: 1000 mL

## 2012-02-27 MED ORDER — HYDROMORPHONE HCL PF 1 MG/ML IJ SOLN
0.2500 mg | INTRAMUSCULAR | Status: DC | PRN
  Administered 2012-02-27 (×2): 0.5 mg via INTRAVENOUS

## 2012-02-27 MED ORDER — LIDOCAINE HCL (CARDIAC) 20 MG/ML IV SOLN
INTRAVENOUS | Status: DC | PRN
  Administered 2012-02-27: 100 mg via INTRAVENOUS

## 2012-02-27 MED ORDER — CHLORHEXIDINE GLUCONATE 4 % EX LIQD: 1.0000 "application " | Freq: Once | CUTANEOUS | Status: DC

## 2012-02-27 MED ORDER — OXYCODONE HCL 5 MG PO TABS: 5.0000 mg | ORAL_TABLET | Freq: Once | ORAL | Status: DC | PRN

## 2012-02-27 MED ORDER — HYDROMORPHONE HCL PF 1 MG/ML IJ SOLN
INTRAMUSCULAR | Status: AC
  Filled 2012-02-27: qty 1

## 2012-02-27 MED ORDER — MIDAZOLAM HCL 5 MG/5ML IJ SOLN
INTRAMUSCULAR | Status: DC | PRN
  Administered 2012-02-27: 2 mg via INTRAVENOUS

## 2012-02-27 MED ORDER — LACTATED RINGERS IV SOLN
INTRAVENOUS | Status: DC | PRN
  Administered 2012-02-27: 10:00:00 via INTRAVENOUS

## 2012-02-27 MED ORDER — SODIUM CHLORIDE 0.9 % IV SOLN
INTRAVENOUS | Status: DC | PRN
  Administered 2012-02-27: 11:00:00 via INTRAVENOUS

## 2012-02-27 MED ORDER — BUPIVACAINE-EPINEPHRINE PF 0.25-1:200000 % IJ SOLN
INTRAMUSCULAR | Status: AC
  Filled 2012-02-27: qty 30

## 2012-02-27 MED ORDER — PROPOFOL 10 MG/ML IV BOLUS
INTRAVENOUS | Status: DC | PRN
  Administered 2012-02-27: 200 mg via INTRAVENOUS

## 2012-02-27 MED ORDER — 0.9 % SODIUM CHLORIDE (POUR BTL) OPTIME
TOPICAL | Status: DC | PRN
  Administered 2012-02-27: 1000 mL

## 2012-02-27 MED ORDER — BUPIVACAINE-EPINEPHRINE 0.25% -1:200000 IJ SOLN
INTRAMUSCULAR | Status: DC | PRN
  Administered 2012-02-27: 30 mL

## 2012-02-27 MED ORDER — GLYCOPYRROLATE 0.2 MG/ML IJ SOLN
INTRAMUSCULAR | Status: DC | PRN
  Administered 2012-02-27: 0.4 mg via INTRAVENOUS
  Administered 2012-02-27: 0.1 mg via INTRAVENOUS

## 2012-02-27 MED ORDER — OXYCODONE HCL 5 MG/5ML PO SOLN: 5.0000 mg | Freq: Once | ORAL | Status: DC | PRN

## 2012-02-27 MED ORDER — SODIUM CHLORIDE 0.9 % IV SOLN
INTRAVENOUS | Status: DC | PRN
  Administered 2012-02-27: 10:00:00

## 2012-02-27 SURGICAL SUPPLY — 51 items
ADH SKN CLS APL DERMABOND .7 (GAUZE/BANDAGES/DRESSINGS) ×1
APPLIER CLIP ROT 10 11.4 M/L (STAPLE) ×2
APR CLP MED LRG 11.4X10 (STAPLE) ×1
BAG SPEC RTRVL LRG 6X4 10 (ENDOMECHANICALS) ×1
BLADE SURG ROTATE 9660 (MISCELLANEOUS) IMPLANT
CANISTER SUCTION 2500CC (MISCELLANEOUS) ×2 IMPLANT
CHLORAPREP W/TINT 26ML (MISCELLANEOUS) ×2 IMPLANT
CLIP APPLIE ROT 10 11.4 M/L (STAPLE) ×1 IMPLANT
CLOTH BEACON ORANGE TIMEOUT ST (SAFETY) ×2 IMPLANT
COVER MAYO STAND STRL (DRAPES) ×2 IMPLANT
COVER SURGICAL LIGHT HANDLE (MISCELLANEOUS) ×2 IMPLANT
DECANTER SPIKE VIAL GLASS SM (MISCELLANEOUS) ×3 IMPLANT
DERMABOND ADVANCED (GAUZE/BANDAGES/DRESSINGS) ×1
DERMABOND ADVANCED .7 DNX12 (GAUZE/BANDAGES/DRESSINGS) ×1 IMPLANT
DRAPE C-ARM 42X72 X-RAY (DRAPES) ×2 IMPLANT
DRAPE UTILITY 15X26 W/TAPE STR (DRAPE) ×4 IMPLANT
ELECT REM PT RETURN 9FT ADLT (ELECTROSURGICAL) ×2
ELECTRODE REM PT RTRN 9FT ADLT (ELECTROSURGICAL) ×1 IMPLANT
FILTER SMOKE EVAC LAPAROSHD (FILTER) IMPLANT
GLOVE BIO SURGEON STRL SZ 6.5 (GLOVE) ×1 IMPLANT
GLOVE BIO SURGEON STRL SZ8 (GLOVE) ×1 IMPLANT
GLOVE BIOGEL PI IND STRL 7.0 (GLOVE) IMPLANT
GLOVE BIOGEL PI IND STRL 7.5 (GLOVE) IMPLANT
GLOVE BIOGEL PI IND STRL 8 (GLOVE) IMPLANT
GLOVE BIOGEL PI INDICATOR 7.0 (GLOVE) ×3
GLOVE BIOGEL PI INDICATOR 7.5 (GLOVE) ×1
GLOVE BIOGEL PI INDICATOR 8 (GLOVE) ×1
GLOVE ECLIPSE 6.5 STRL STRAW (GLOVE) ×1 IMPLANT
GLOVE ECLIPSE 7.5 STRL STRAW (GLOVE) ×1 IMPLANT
GLOVE EUDERMIC 7 POWDERFREE (GLOVE) ×2 IMPLANT
GOWN PREVENTION PLUS XLARGE (GOWN DISPOSABLE) ×3 IMPLANT
GOWN PREVENTION PLUS XXLARGE (GOWN DISPOSABLE) ×1 IMPLANT
GOWN STRL NON-REIN LRG LVL3 (GOWN DISPOSABLE) ×5 IMPLANT
KIT BASIN OR (CUSTOM PROCEDURE TRAY) ×2 IMPLANT
KIT ROOM TURNOVER OR (KITS) ×2 IMPLANT
NS IRRIG 1000ML POUR BTL (IV SOLUTION) ×2 IMPLANT
PAD ARMBOARD 7.5X6 YLW CONV (MISCELLANEOUS) ×2 IMPLANT
POUCH SPECIMEN RETRIEVAL 10MM (ENDOMECHANICALS) ×2 IMPLANT
SCISSORS LAP 5X35 DISP (ENDOMECHANICALS) ×2 IMPLANT
SET CHOLANGIOGRAPH 5 50 .035 (SET/KITS/TRAYS/PACK) ×2 IMPLANT
SET IRRIG TUBING LAPAROSCOPIC (IRRIGATION / IRRIGATOR) ×2 IMPLANT
SLEEVE ENDOPATH XCEL 5M (ENDOMECHANICALS) ×2 IMPLANT
SPECIMEN JAR SMALL (MISCELLANEOUS) ×2 IMPLANT
SUT MNCRL AB 4-0 PS2 18 (SUTURE) ×2 IMPLANT
TOWEL OR 17X24 6PK STRL BLUE (TOWEL DISPOSABLE) ×2 IMPLANT
TOWEL OR 17X26 10 PK STRL BLUE (TOWEL DISPOSABLE) ×2 IMPLANT
TRAY LAPAROSCOPIC (CUSTOM PROCEDURE TRAY) ×2 IMPLANT
TROCAR XCEL BLUNT TIP 100MML (ENDOMECHANICALS) ×2 IMPLANT
TROCAR XCEL NON-BLD 11X100MML (ENDOMECHANICALS) ×2 IMPLANT
TROCAR XCEL NON-BLD 5MMX100MML (ENDOMECHANICALS) ×4 IMPLANT
WATER STERILE IRR 1000ML POUR (IV SOLUTION) IMPLANT

## 2012-02-27 NOTE — Anesthesia Procedure Notes (Signed)
Procedure Name: Intubation Date/Time: 02/27/2012 10:37 AM Performed by: Jerilee Hoh Pre-anesthesia Checklist: Patient identified, Emergency Drugs available, Suction available and Patient being monitored Patient Re-evaluated:Patient Re-evaluated prior to inductionOxygen Delivery Method: Circle system utilized Preoxygenation: Pre-oxygenation with 100% oxygen Intubation Type: IV induction Ventilation: Mask ventilation without difficulty Laryngoscope Size: Mac and 3 Grade View: Grade II Tube type: Oral Number of attempts: 1 Airway Equipment and Method: Stylet Placement Confirmation: ETT inserted through vocal cords under direct vision,  positive ETCO2 and breath sounds checked- equal and bilateral Secured at: 21 cm Tube secured with: Tape Dental Injury: Teeth and Oropharynx as per pre-operative assessment

## 2012-02-27 NOTE — Progress Notes (Signed)
Internal Medicine Teaching Service Attending Note Date: 02/27/2012  Patient name: Mary Daniels  Medical record number: [id number]  Date of birth: 29-Aug-1955   I have seen and evaluated Mary Daniels and discussed their care with the Residency Team.   Without complaints, no abd pain    . cefTRIAXone (ROCEPHIN)  IV  1 g Intravenous Q24H  . ciprofloxacin-dexamethasone  4 drop Both Ears BID  . enoxaparin  40 mg Subcutaneous Q24H  . HYDROmorphone      . loratadine  10 mg Oral Daily  . montelukast  10 mg Oral Daily  .  morphine injection  2 mg Intravenous Once  . pantoprazole  40 mg Oral Daily  . senna-docusate  1 tablet Oral BID  . sodium chloride  3 mL Intravenous Q12H  . DISCONTD: chlorhexidine  1 application Topical Once  . DISCONTD: chlorhexidine  1 application Topical Once  . DISCONTD: enoxaparin (LOVENOX) injection  40 mg Subcutaneous Q24H  . DISCONTD: ketorolac  30 mg Intravenous Q6H     Physical Exam: Blood pressure 133/78, pulse 80, temperature 96.8 F (36 C), temperature source Oral, resp. rate 18, height 5\' 2"  (1.575 m), weight 118 kg (260 lb 2.3 oz), SpO2 95.00%. General appearance: alert, cooperative, distracted and no distress Resp: clear to auscultation bilaterally Cardio: regular rate and rhythm GI: normal findings: soft, non-tender and abnormal findings:  hypoactive bowel sounds  Lab results: Results for orders placed during the hospital encounter of 02/23/12 (from the past 24 hour(s))  CBC     Status: Abnormal   Collection Time   02/27/12  4:55 AM      Component Value Range   WBC 12.9 (*) 4.0 - 10.5 K/uL   RBC 3.87  3.87 - 5.11 MIL/uL   Hemoglobin 11.0 (*) 12.0 - 15.0 g/dL   HCT 40.9 (*) 81.1 - 91.4 %   MCV 90.4  78.0 - 100.0 fL   MCH 28.4  26.0 - 34.0 pg   MCHC 31.4  30.0 - 36.0 g/dL   RDW 78.2  95.6 - 21.3 %   Platelets 237  150 - 400 K/uL  BASIC METABOLIC PANEL     Status: Abnormal   Collection Time   02/27/12  4:55 AM      Component Value  Range   Sodium 140  135 - 145 mEq/L   Potassium 3.3 (*) 3.5 - 5.1 mEq/L   Chloride 101  96 - 112 mEq/L   CO2 31  19 - 32 mEq/L   Glucose, Bld 94  70 - 99 mg/dL   BUN 11  6 - 23 mg/dL   Creatinine, Ser 0.86  0.50 - 1.10 mg/dL   Calcium 9.2  8.4 - 57.8 mg/dL   GFR calc non Af Amer >90  >90 mL/min   GFR calc Af Amer >90  >90 mL/min    Imaging results:  Dg Chest 2 View  02/26/2012  *RADIOLOGY REPORT*  Clinical Data: Productive cough.  CHEST - 2 VIEW  Comparison: Portable chest 02/23/2012 and PA and lateral chest 05/08/2009.  Findings: Blunting of the costophrenic angles is compatible with the presence of very small effusions.  No consolidative process, pneumothorax or effusion is identified.  Heart size is mildly enlarged.  IMPRESSION: Very small bilateral pleural effusions.  No consolidative process is identified.   Original Report Authenticated By: Bernadene Bell. D'ALESSIO, M.D.    Dg Cholangiogram Operative  02/27/2012  *RADIOLOGY REPORT*  Clinical Data:   Chole lithiasis, pancreatitis.  INTRAOPERATIVE CHOLANGIOGRAM  Technique:  Cholangiographic images from the C-arm fluoroscopic device were submitted for interpretation post-operatively.  Please see the procedural report for the amount of contrast and the fluoroscopy time utilized.  Comparison:  None  Findings:  No persistent filling defects in the common duct. Intrahepatic ducts are incompletely visualized, appearing decompressed centrally. Contrast passes into the duodenum.  IMPRESSION  Negative for retained common duct stone.   Original Report Authenticated By: Osa Craver, M.D.     Assessment and Plan: I agree with the formulated Assessment and Plan with the following changes:  Pancreatitis Cholecystectomy Completes ceftriaxone today for pyruia.  Will watch her progress post-op and make d/c plans accordingly.  Advance diet as tolerated.

## 2012-02-27 NOTE — Progress Notes (Signed)
Patient ID: Mary Daniels, female   DOB: October 17, 1955, 56 y.o.   MRN: [id number] Medical Student Daily Progress Note   Subjective:    Interval Events:  Per Mrs. Payette she had no acute events overnight and is feeling much better today.  Since yesterday, she was switched from hydromorphone to morphine for pain control but was switched back to hydromorphone saying the morphine did not dull the pain at all, it gave her a "hazy almost dizzy feeling" but she was still very aware of the pain.  She was able to tolerate a liquid meal yesterday without difficulty specifically, no increased pain.  She reports having a bowel movement this morning around 0600 with no difficulty other than some abdominal pain during.  She continues to urinate without difficulty.  She was able to perform ADLs this morning with no difficulty stating this made her "feel like a new person".  This morning she denies nausea, vomiting, fever, chills, SOB, dyspnea, and chest pain.  Abdominal pain persists and is unchanged in character or location.  Bilateral flank pain persists.    Objective:    Vital Signs:   Temp:  [97.8 F (36.6 C)-98.6 F (37 C)] 98.3 F (36.8 C) (10/25 0438) Pulse Rate:  [70-81] 76  (10/25 0438) Resp:  [15-20] 20  (10/25 0438) BP: (111-138)/(42-76) 135/76 mmHg (10/25 0438) SpO2:  [90 %-99 %] 93 % (10/25 0438) Weight:  [118 kg (260 lb 2.3 oz)] 118 kg (260 lb 2.3 oz) (10/25 0438) Last BM Date: 02/27/12   Weights: 24-hour Weight change: 1.6 kg (3 lb 8.4 oz)  Filed Weights   02/25/12 0530 02/26/12 0538 02/27/12 0438  Weight: 115.4 kg (254 lb 6.6 oz) 116.4 kg (256 lb 9.9 oz) 118 kg (260 lb 2.3 oz)   Net since admission:  4.9 kg   Intake/Output:   Intake/Output Summary (Last 24 hours) at 02/27/12 0810 Last data filed at 02/27/12 0600  Gross per 24 hour  Intake   2190 ml  Output      0 ml  Net   2190 ml     Net since admission:  5733.42   Physical Exam: GENERAL:  alert and oriented; resting  comfortably in chair and in no distress EYES:  pupils equal, round, and reactive to light; sclera anicteric ENT:  moist mucosa, dried blood in left nare none in right this is improved since yesterday, throat clean and non erythematous NECK:  no JVD, anterior cervical lymphandenopathy mobile and slightly tender LUNGS:  clear to auscultation bilaterally, normal work of breathing BACK: Bilateral CVA tenderness to palpation HEART:  normal rate; regular rhythm; normal S1 and S2, no S3 or S4 appreciated; no murmurs, rubs, or clicks ABDOMEN:  soft, tender to deep palpation worse at mid epigastric region, normal bowel sounds, no masses palpated NEURO: EOMI, hearing intact and symmetric, symmetric smile, sensation to light touch intact and symmetric throughout, 5/5 shoulder shrug, tongue protrudes midline with no deviation EXTREMITIES:  2+ edema in lower extremities, 2+ pulses bilaterally in UE's and LE's SKIN:  normal turgor, erythematous, scaled, partially scabbed rash behind left ear is still present and unchanged   Labs: Basic Metabolic Panel:  Lab 02/27/12 4098 02/26/12 0545 02/25/12 0600 02/24/12 0625 02/23/12 0946 02/23/12 0932  NA 140 140 143 138 142 --  K 3.3* 3.5 3.2* 3.4* 3.4* --  CL 101 104 107 103 101 --  CO2 31 29 26 28  -- 28  GLUCOSE 94 94 79 103* 133* --  BUN  11 13 13 11 16  --  CREATININE 0.70 0.68 0.69 0.69 1.00 --  CALCIUM 9.2 9.2 8.6 -- -- --  MG -- -- -- -- -- 1.9  PHOS -- -- -- -- -- --    Liver Function Tests:  Lab 02/24/12 0625 02/23/12 0932  AST 102* 309*  ALT 159* 210*  ALKPHOS 91 98  BILITOT 0.6 0.6  PROT 6.5 7.5  ALBUMIN 3.1* 3.6    Lab 02/26/12 0545 02/25/12 0600 02/24/12 0625 02/23/12 0932  LIPASE 75* 141* 1290* >3000*  AMYLASE -- -- -- --   CBC:  Lab 02/27/12 0455 02/24/12 0625 02/23/12 0946 02/23/12 0932  WBC 12.9* 12.2* -- 16.0*  NEUTROABS -- -- -- 11.6*  HGB 11.0* 12.3 14.6 13.9  HCT 35.0* 37.1 43.0 41.8  MCV 90.4 88.1 -- 87.6  PLT 237 230 --  274   CBG:  Lab 02/26/12 0751 02/25/12 0745 02/24/12 0749  GLUCAP 95 80 99   Microbiology: Results for orders placed during the hospital encounter of 02/23/12  CULTURE, BLOOD (ROUTINE X 2)     Status: Normal (Preliminary result)   Collection Time   02/23/12 10:35 AM      Component Value Range Status Comment   Specimen Description BLOOD HAND LEFT   Final    Special Requests BOTTLES DRAWN AEROBIC ONLY 3CC   Final    Culture  Setup Time 02/23/2012 18:24   Final    Culture     Final    Value:        BLOOD CULTURE RECEIVED NO GROWTH TO DATE CULTURE WILL BE HELD FOR 5 DAYS BEFORE ISSUING A FINAL NEGATIVE REPORT   Report Status PENDING   Incomplete   CULTURE, BLOOD (ROUTINE X 2)     Status: Normal (Preliminary result)   Collection Time   02/23/12 10:40 AM      Component Value Range Status Comment   Specimen Description BLOOD HAND RIGHT   Final    Special Requests BOTTLES DRAWN AEROBIC ONLY 5CC   Final    Culture  Setup Time 02/23/2012 18:24   Final    Culture     Final    Value:        BLOOD CULTURE RECEIVED NO GROWTH TO DATE CULTURE WILL BE HELD FOR 5 DAYS BEFORE ISSUING A FINAL NEGATIVE REPORT   Report Status PENDING   Incomplete   URINE CULTURE     Status: Normal   Collection Time   02/23/12 11:10 AM      Component Value Range Status Comment   Specimen Description URINE, RANDOM   Final    Special Requests NONE   Final    Culture  Setup Time 02/23/2012 11:40   Final    Colony Count 8,000 COLONIES/ML   Final    Culture INSIGNIFICANT GROWTH   Final    Report Status 02/24/2012 FINAL   Final   URINE CULTURE     Status: Normal   Collection Time   02/25/12 10:38 AM      Component Value Range Status Comment   Specimen Description URINE, CLEAN CATCH   Final    Special Requests NONE   Final    Culture  Setup Time 02/25/2012 16:51   Final    Colony Count 35,000 COLONIES/ML   Final    Culture     Final    Value: Multiple bacterial morphotypes present, none predominant. Suggest  appropriate recollection if clinically indicated.   Report Status 02/26/2012 FINAL  Final   SURGICAL PCR SCREEN     Status: Normal   Collection Time   02/26/12  2:25 AM      Component Value Range Status Comment   MRSA, PCR NEGATIVE  NEGATIVE Final    Staphylococcus aureus NEGATIVE  NEGATIVE Final    Imaging: Dg Chest 2 View  02/26/2012  *RADIOLOGY REPORT*  Clinical Data: Productive cough.  CHEST - 2 VIEW  Comparison: Portable chest 02/23/2012 and PA and lateral chest 05/08/2009.  Findings: Blunting of the costophrenic angles is compatible with the presence of very small effusions.  No consolidative process, pneumothorax or effusion is identified.  Heart size is mildly enlarged.  IMPRESSION: Very small bilateral pleural effusions.  No consolidative process is identified.   Original Report Authenticated By: Bernadene Bell. Maricela Curet, M.D.       Medications:    Infusions:    . dextrose 5 % and 0.45 % NaCl with KCl 20 mEq/L 75 mL/hr at 02/27/12 0600   Scheduled Medications:    . cefTRIAXone (ROCEPHIN)  IV  1 g Intravenous Q24H  . ciprofloxacin-dexamethasone  4 drop Both Ears BID  . enoxaparin (LOVENOX) injection  40 mg Subcutaneous Q24H  . ketorolac  30 mg Intravenous Q6H  . loratadine  10 mg Oral Daily  . montelukast  10 mg Oral Daily  .  morphine injection  2 mg Intravenous Once  . pantoprazole  40 mg Oral Daily  . senna-docusate  1 tablet Oral BID  . sodium chloride  3 mL Intravenous Q12H     PRN Medications: acetaminophen, albuterol, diphenhydrAMINE, HYDROmorphone (DILAUDID) injection, ondansetron (ZOFRAN) IV, ondansetron, DISCONTD:  HYDROmorphone (DILAUDID) injection, DISCONTD:  morphine injection    Assessment/ Plan:    Ms. Tetro is a pleasant 56 yo woman with a PMHx significant for morbid obesity, asthma, and surgically repaired Schatzki's ring on hospital day #4 being treated for acute pancreatitis and chronic cholecystitis (dx's strongly supported by history, physical,  elevated pancreatic enzymes and ultrasound findings). Pt is on last day of ceftriaxone for pyelonephritis and day 3 of 7 ciprofloxacin/dexamethasone for otitis externa.   1. Acute pancreatitis:  -Lipase has dropped to 75 since admission, serial clinical exams to monitor abdominal pain, once pain is under control and patient is medically stable surgery would like to proceed with cholecystectomy.  -Hydromorphone 1 mg every 2 hours prn  -Continue ketorolac, and tylenol for analgesia  -Zofran for nausea  -Pt tolerating PO liquids, after surgery will continue liquid diet if tolerated progress to normal diet.   2. Chronic Cholecystitis:  -Given ultrasound results (cholelithiasis and wall thickening) cholecystitis is a possible cause of this patient's pancreatitis. Cholecystectomy is planned once pancreatitis has resolved.   3. Pyelonephritis:  -Clean catch specimen showed many bacteria on U/A. Ceftriaxone tx seems to have resolved pt's back pain and per her report urine appears normal as well. Today is last day of ceftriaxone course.   4. Hypotension:  -Has resolved. Continue to monitor with vital signs q8 hours.   5. Hypoxia:  -Likely due to aggressive fluid resuscitation.  Pt's fluids have been cut to below maintenance and was taken off of oxygen yesterday, has had normal range O2 saturations since.  Would like to dc iv fluids given pt's ability to tolerate PO.  6. Otitis Externa:  -Pain has improved since beginning ciprofloxacin/dexamethasone drops.  -Day 3 of 7 as of 1200 continue ciprofloxacin/dexamethasone eardrops.   7. Itching:  -Began shortly after starting ceftriaxone making it the likely cause.  Has since resolved with Benadryl. Will continue Benadryl prn.   8. Sinusitis:  -Physical exam revealed frontal/maxillary sinus tenderness and patient complains of productive cough. Likely inflammation from allergies or viral sinusitis.  CXR was read as normal.   9. Rash:  -Has not changed  since yesterday. No signs of infection, does not have appearance of fungal skin infection, likely dry skin. Pt counseled to not scratch given potential for infection. No need for treatment at this time.   10. Asthma/seasonal Allergies:  -Stable. Continue home medications of albuterol as needed, monteleukast, and loratadine   11. Morbid Obesity:  -Weight gain of 4.9 kg since admission this is likely due to fluid retention secondary to pancreatitis and iv fluids. We have cut fluids to 75 mL/hr well below maintenance. Likely to resolve once pt is able to take PO and iv fluids are discontinued. BMI 47.6   12. Hypokalemia:  -Potassium at 3.3 today. Will likely correct once patient is taking a normal diet again.    13. Anemia: -Likely due to dilutional effect of iv fluids.  No pallor on physical exam.  Will discontinue iv fluids and repeat CBC.  14. FEN/GI:  -Pt able to tolerate liquid diet yesterday evening.  NPO since midnight in preparation for surgery today.  -Discontinue iv fluids give patient's ability to tolerate PO fluids and peripheral edema  15. Activity:  -Up with assistance   16. Dispo:  -Should remain internal medicine inpatient until pancreatitis has resolved  -D/C to home after surgery         Length of Stay: 4 days   This is a Psychologist, occupational Note.  The care of the patient was discussed with Dr. Earlene Plater and the assessment and plan formulated with their assistance.  Please see their attached note or addendum for official documentation of the daily encounter.  Addendum to medical student daily progress note:  I have seen the patient and reviewed the daily progress note by Frederik Pear and discussed the care of the patient with him. See below for documentation of my findings, assessment, and plans.   Subjective:    Interval Events:  I agree with above documentation.    Objective:    Vital Signs:  Above vital signs were reviewed.   Physical Exam: GENERAL:  alert  and oriented; resting comfortably in bed and in no distress LUNGS:  clear to auscultation bilaterally, normal work of breathing HEART:  normal rate; regular rhythm; normal S1 and S2, no S3 or S4 appreciated; no murmurs, rubs, or clicks ABDOMEN:  soft, tender to palpation over epigastrium, normal bowel sounds, no masses palpated EXTREMITIES:  2+ edema SKIN:  normal turgor     Labs:  Above labs and radiographic studies were reviewed.    Assessment/ Plan:    I agree with the above assessment and plan.  Pancreatitis:  Will advance diet post-op and continue analgesia.  UTI:  If she did in fact have a UTI, she completed therapy for this today with her 3rd CTX dose.  Otitis externa:  Continue otic drops.  Hypokalemia:  Will recheck in AM and replace if necessary.    Length of Stay: 4 days   Signed by:  Dorthula Rue. Earlene Plater, MD PGY-I, Internal Medicine Pager [phone]  02/27/2012, 1:46 PM

## 2012-02-27 NOTE — Progress Notes (Signed)
Acute pancreatitis  Assessment: Acute pancreatitis Biliary pancreatitis, improved clinically and by labs  Plan: Cholecystectomy with IOC today. I have discussed the indications for laparoscopic cholecystectomy with her and provided educational material. We have discussed the risks of surgery, including general risks such as bleeding, infection, lung and heart issues etc. We have also discussed the potential for injuries to other organs, bile duct leaks, and other unexpected events. We have also talked about the fact that this may need to be converted to open under certain circumstances. We discussed the typical post op recovery and the fact that there is a good likelihood of improvement in symptoms and return to normal activity.  She understands this and wishes to proceedsurgery. I believe all of her questions have been answered.       Subjective: Still some pain, but better daily. Wants to go ahead with cholecystectomy. Notes that Dilaudid works better than Morphine for pain  Objective: Vital signs in last 24 hours: Temp:  [97.8 F (36.6 C)-98.6 F (37 C)] 98.3 F (36.8 C) (10/25 0438) Pulse Rate:  [70-81] 76  (10/25 0438) Resp:  [15-20] 20  (10/25 0438) BP: (111-138)/(42-76) 135/76 mmHg (10/25 0438) SpO2:  [90 %-99 %] 93 % (10/25 0438) Weight:  [260 lb 2.3 oz (118 kg)] 260 lb 2.3 oz (118 kg) (10/25 0438) Last BM Date: 02/27/12  Intake/Output from previous day: 10/24 0701 - 10/25 0700 In: 2190 [I.V.:2140; IV Piggyback:50] Out: -  Intake/Output this shift:    General appearance: alert, cooperative and no distress Resp: clear to auscultation bilaterally GI: Soft, residual minimal tenderness, no guarding or rebound  Lab Results:  Results for orders placed during the hospital encounter of 02/23/12 (from the past 24 hour(s))  GLUCOSE, CAPILLARY     Status: Normal   Collection Time   02/26/12  7:51 AM      Component Value Range   Glucose-Capillary 95  70 - 99 mg/dL  CBC      Status: Abnormal   Collection Time   02/27/12  4:55 AM      Component Value Range   WBC 12.9 (*) 4.0 - 10.5 K/uL   RBC 3.87  3.87 - 5.11 MIL/uL   Hemoglobin 11.0 (*) 12.0 - 15.0 g/dL   HCT 27.2 (*) 53.6 - 64.4 %   MCV 90.4  78.0 - 100.0 fL   MCH 28.4  26.0 - 34.0 pg   MCHC 31.4  30.0 - 36.0 g/dL   RDW 03.4  74.2 - 59.5 %   Platelets 237  150 - 400 K/uL  BASIC METABOLIC PANEL     Status: Abnormal   Collection Time   02/27/12  4:55 AM      Component Value Range   Sodium 140  135 - 145 mEq/L   Potassium 3.3 (*) 3.5 - 5.1 mEq/L   Chloride 101  96 - 112 mEq/L   CO2 31  19 - 32 mEq/L   Glucose, Bld 94  70 - 99 mg/dL   BUN 11  6 - 23 mg/dL   Creatinine, Ser 6.38  0.50 - 1.10 mg/dL   Calcium 9.2  8.4 - 75.6 mg/dL   GFR calc non Af Amer >90  >90 mL/min   GFR calc Af Amer >90  >90 mL/min     Studies/Results Radiology     MEDS, Scheduled    . cefTRIAXone (ROCEPHIN)  IV  1 g Intravenous Q24H  . ciprofloxacin-dexamethasone  4 drop Both Ears BID  . enoxaparin (  LOVENOX) injection  40 mg Subcutaneous Q24H  . ketorolac  30 mg Intravenous Q6H  . loratadine  10 mg Oral Daily  . montelukast  10 mg Oral Daily  .  morphine injection  2 mg Intravenous Once  . pantoprazole  40 mg Oral Daily  . senna-docusate  1 tablet Oral BID  . sodium chloride  3 mL Intravenous Q12H       LOS: 4 days    Currie Paris, MD, [facility], [place] [phone]   02/27/2012 7:50 AM

## 2012-02-27 NOTE — Op Note (Signed)
Mary Daniels April 05, 1956 [id number] 02/23/2012  Preoperative diagnosis: gallstones and resolving biliary pancreatitis  Postoperative diagnosis: same  Procedure: laparoscopic cholecystectomy with intraoperative cholangiogram  Surgeon: Currie Paris, MD, FACS  Assistant surgeon: Dr. Violeta Gelinas   Anesthesia: General  Clinical History and Indications: This patient was admitted a few days ago with biliary pancreatitis. Clinically and chemically she has improved. She still having some back pain. However we thought she was stable enough to proceed to laparoscopic cholecystectomy with operative cholangiogram..  Description of procedure: The patient was seen in the preoperative area. I reviewed the plans for the procedure with her as well as the risks and complications. She had no further questions and wished to proceed.  The patient was taken to the operating room. After satisfactory general endotracheal anesthesia had been obtained the abdomen was prepped and draped. A time out was done.  0.25% plain Marcaine was used at all incisions. I made an umbilical incision, identified the fascia and opened that, and entered the peritoneal cavity under direct vision. A 0 Vicryl pursestring suture was placed and the Hasson cannula was introduced under direct vision and secured with the pursestring. The abdomen was inflated to 15 cm.  The camera was placed and there were no gross abnormalities. The patient was then placed in reverse Trendelenburg and tilted to the left. A 10/11 trocar was placed in the epigastrium and two 5 mm trochars placed laterally all under direct vision.  There was some cloudy peritoneal fluid present which was aspirated. This is consistent with her pancreatitis. There is mild edema of the gallbladder and pericholecystic tissues again I thought secondary to pancreatitis. She did not appear to have acute cholecystitis.  I was able to open the peritoneum and identified  the cystic artery which was anterior to the cystic duct and dissected out for a long segment and saw coming into the gallbladder. I could see the cystic duct behind it but could not access to dissected out until the artery was divided. I put 3 clips on the artery and divided.  I was able to dissect out a long segment of cystic duct and put a clip on it.  An intraoperative cholangiogram was then performed. A Cook catheter was introduced percutaneously and placed in the cystic duct. The cholangiogram showed good filling of the common duct and hepatic radicals and free flow into the duodenum. No abnormalities were noted.  The catheter was removed and 3 clips placed on the stay side of the cystic duct. The duct was then divided.  Additional clips are placed on the cystic artery and it was divided. The gallbladder was then removed from below to above the coagulation current of the cautery. It was then placed in a bag to be retrieved later.  The abdomen was irrigated and a check for hemostasis along the bed of the gallbladder made. Once everything appeared to be dry we were able to move the camera to the epigastric port and removed the gallbladder through the umbilical port.  The abdomen was reinsufflated and a final check for hemostasis made. There is no evidence of bleeding or bile leakage. The lateral ports were removed under direct vision and there was no bleeding. The umbilical site was closed with a pursestring, watching with the camera in the epigastric port. The abdomen was then deflated through the epigastric port and that was removed. Skin was closed with 4-0 Monocryl subcuticular and Dermabond.  The patient tolerated the procedure well. There were no operative complications.  EBL was minimal. All counts were correct.  Currie Paris, MD, FACS 02/27/2012 11:40 AM

## 2012-02-27 NOTE — Preoperative (Signed)
Beta Blockers   Reason not to administer Beta Blockers:Not Applicable 

## 2012-02-27 NOTE — Anesthesia Postprocedure Evaluation (Signed)
  Anesthesia Post-op Note  Patient: Mary Daniels  Procedure(s) Performed: Procedure(s) (LRB) with comments: LAPAROSCOPIC CHOLECYSTECTOMY WITH INTRAOPERATIVE CHOLANGIOGRAM (N/A)  Patient Location: PACU  Anesthesia Type: General  Level of Consciousness: awake  Airway and Oxygen Therapy: Patient Spontanous Breathing and Patient connected to nasal cannula oxygen  Post-op Pain: mild  Post-op Assessment: Post-op Vital signs reviewed, Patient's Cardiovascular Status Stable, Respiratory Function Stable, Patent Airway and No signs of Nausea or vomiting  Post-op Vital Signs: Reviewed and stable  Complications: No apparent anesthesia complications

## 2012-02-27 NOTE — Transfer of Care (Signed)
Immediate Anesthesia Transfer of Care Note  Patient: Mary Daniels  Procedure(s) Performed: Procedure(s) (LRB) with comments: LAPAROSCOPIC CHOLECYSTECTOMY WITH INTRAOPERATIVE CHOLANGIOGRAM (N/A)  Patient Location: PACU  Anesthesia Type: General  Level of Consciousness: awake, alert , oriented and patient cooperative  Airway & Oxygen Therapy: Patient Spontanous Breathing and Patient connected to face mask oxygen  Post-op Assessment: Report given to PACU RN, Post -op Vital signs reviewed and stable and Patient moving all extremities  Post vital signs: Reviewed and stable  Complications: No apparent anesthesia complications

## 2012-02-27 NOTE — Anesthesia Preprocedure Evaluation (Signed)
Anesthesia Evaluation  Patient identified by MRN, date of birth, ID band Patient awake    Reviewed: Allergy & Precautions, H&P , NPO status , Patient's Chart, lab work & pertinent test results, reviewed documented beta blocker date and time   Airway Mallampati: II TM Distance: >3 FB Neck ROM: full    Dental   Pulmonary asthma ,  breath sounds clear to auscultation  + decreased breath sounds      Cardiovascular negative cardio ROS  Rhythm:regular     Neuro/Psych negative neurological ROS  negative psych ROS   GI/Hepatic Neg liver ROS, GERD-  Medicated and Controlled,  Endo/Other  Morbid obesity  Renal/GU negative Renal ROS  negative genitourinary   Musculoskeletal   Abdominal   Peds  Hematology negative hematology ROS (+)   Anesthesia Other Findings See surgeon's H&P   Reproductive/Obstetrics negative OB ROS                           Anesthesia Physical Anesthesia Plan  ASA: III  Anesthesia Plan: General   Post-op Pain Management:    Induction: Intravenous  Airway Management Planned: Oral ETT  Additional Equipment:   Intra-op Plan:   Post-operative Plan: Extubation in OR  Informed Consent: I have reviewed the patients History and Physical, chart, labs and discussed the procedure including the risks, benefits and alternatives for the proposed anesthesia with the patient or authorized representative who has indicated his/her understanding and acceptance.   Dental Advisory Given  Plan Discussed with: CRNA and Surgeon  Anesthesia Plan Comments:         Anesthesia Quick Evaluation

## 2012-02-28 LAB — COMPREHENSIVE METABOLIC PANEL
ALT: 63 U/L — ABNORMAL HIGH (ref 0–35)
AST: 39 U/L — ABNORMAL HIGH (ref 0–37)
Calcium: 9.3 mg/dL (ref 8.4–10.5)
Creatinine, Ser: 0.58 mg/dL (ref 0.50–1.10)
Sodium: 139 mEq/L (ref 135–145)
Total Protein: 6.7 g/dL (ref 6.0–8.3)

## 2012-02-28 LAB — CBC
HCT: 31.7 % — ABNORMAL LOW (ref 36.0–46.0)
Hemoglobin: 10.7 g/dL — ABNORMAL LOW (ref 12.0–15.0)
MCH: 29.9 pg (ref 26.0–34.0)
MCHC: 33.8 g/dL (ref 30.0–36.0)
RBC: 3.58 MIL/uL — ABNORMAL LOW (ref 3.87–5.11)

## 2012-02-28 MED ORDER — OXYCODONE HCL 5 MG PO TABS
5.0000 mg | ORAL_TABLET | ORAL | Status: DC | PRN
  Administered 2012-02-28 (×2): 5 mg via ORAL
  Filled 2012-02-28: qty 2
  Filled 2012-02-28 (×2): qty 1

## 2012-02-28 MED ORDER — HYDROMORPHONE HCL PF 1 MG/ML IJ SOLN: 0.5000 mg | INTRAMUSCULAR | Status: DC | PRN

## 2012-02-28 MED ORDER — SENNOSIDES-DOCUSATE SODIUM 8.6-50 MG PO TABS: 1.0000 | ORAL_TABLET | Freq: Two times a day (BID) | ORAL | Status: DC

## 2012-02-28 MED ORDER — OXYCODONE HCL 5 MG PO TABS: 5.0000 mg | ORAL_TABLET | ORAL | Status: DC | PRN

## 2012-02-28 MED ORDER — HYDROMORPHONE HCL PF 1 MG/ML IJ SOLN: 1.0000 mg | INTRAMUSCULAR | Status: DC | PRN

## 2012-02-28 MED ORDER — ACETAMINOPHEN 500 MG PO TABS
1000.0000 mg | ORAL_TABLET | Freq: Four times a day (QID) | ORAL | Status: DC
  Administered 2012-02-28 (×2): 1000 mg via ORAL
  Filled 2012-02-28 (×4): qty 2

## 2012-02-28 MED ORDER — CIPROFLOXACIN-DEXAMETHASONE 0.3-0.1 % OT SUSP: 4.0000 [drp] | Freq: Two times a day (BID) | OTIC | Status: AC

## 2012-02-28 MED ORDER — ACETAMINOPHEN 500 MG PO TABS: 1000.0000 mg | ORAL_TABLET | Freq: Four times a day (QID) | ORAL | Status: DC | PRN

## 2012-02-28 NOTE — Discharge Summary (Signed)
Patient Name:  Mary Daniels MRN: [id number]  PCP: Devra Dopp DOB:  14-Oct-1955       Date of Admission:  02/23/2012  Date of Discharge:  02/28/2012      Attending Physician: Ginnie Smart, MD        DISCHARGE DIAGNOSES: 1.   Acute pancreatitis 2.   Chronic cholecystitis 3.   Pyelonephritis 4.   Otitis externa 5.   Morbid obesity 6.   Hypokalemia   DISPOSITION AND FOLLOW-UP:   Follow-up Information    Follow up with Currie Paris, MD. Schedule an appointment as soon as possible for a visit in 3 weeks.   Contact information:   [address] [place] [address] [phone]       Follow up with Devra Dopp, MD. On 03/04/2012. (PRIMARY CARE - Your appointment is at 9:30 on October 31.)    Contact information:   [address] [place] [address] [phone]         Discharge Orders    Future Orders Please Complete By Expires   Diet - low sodium heart healthy      Increase activity slowly      Discharge instructions      Comments:   Remember not to take more than 4,000mg  of acetaminophen in 24 hours.  Percocet, Tylenol, and many other medications have acetaminophen in them; so make sure that the total dose from all sources does not add up to more than 4,000mg .   Discharge wound care:      Comments:   Do not submerge surgical wounds under water until Monday, October 28.   Call MD for:  temperature >100.4      Call MD for:  redness, tenderness, or signs of infection (pain, swelling, redness, odor or green/yellow discharge around incision site)      Call MD for:  severe uncontrolled pain      Call MD for:  persistant nausea and vomiting          DISCHARGE MEDICATIONS:   Medication List     As of 02/28/2012  5:01 PM    TAKE these medications         acetaminophen 500 MG tablet   Commonly known as: TYLENOL   Take 2 tablets (1,000 mg total) by mouth every 6 (six) hours as needed for pain.      albuterol 108  (90 BASE) MCG/ACT inhaler   Commonly known as: PROVENTIL HFA;VENTOLIN HFA   Inhale 2 puffs into the lungs every 6 (six) hours as needed. For wheezing      cetirizine 10 MG tablet   Commonly known as: ZYRTEC   Take 10 mg by mouth daily.      ciprofloxacin-dexamethasone otic suspension   Commonly known as: CIPRODEX   Place 4 drops into both ears 2 (two) times daily. For 3 more days after today. End on 10/29.      montelukast 10 MG tablet   Commonly known as: SINGULAIR   Take 10 mg by mouth daily.      omeprazole 20 MG capsule   Commonly known as: PRILOSEC   Take 20 mg by mouth daily.      One-A-Day 50 Plus Tabs   Take 1 tablet by mouth daily.      oxyCODONE 5 MG immediate release tablet   Commonly known as: Oxy IR/ROXICODONE   Take 1-2 tablets (5-10 mg total) by mouth every 4 (four) hours as needed.  senna-docusate 8.6-50 MG per tablet   Commonly known as: Senokot-S   Take 1 tablet by mouth 2 (two) times daily.         CONSULTS:  1.   General surgery   PROCEDURES PERFORMED:  Dg Chest Port 1 View 02/23/2012  FINDINGS: Enlargement of cardiac silhouette. Slight pulmonary vascular congestion. Mediastinal contours normal. Bronchitic changes with right basilar atelectasis. Lungs otherwise clear. No pleural effusion or pneumothorax. Bones unremarkable. IMPRESSION: Mild enlargement of cardiac silhouette with slight pulmonary vascular congestion. Bronchitic changes with right basilar atelectasis.  US Abdomen Complete 02/23/2012   FINDINGS:  Gallbladder:  Granular shadowing echogenic debris is seen in the gallbladder.  Wall measures 4 mm.  No sonographic Murphy's sign.  Common bile duct:  Measures 5 mm, within normal limits.  Liver:  Diffusely increased in echogenicity.  IVC:  Appears normal.  Pancreas:  No focal abnormality seen.  Spleen:  Measures 6.8 cm, negative.  Right Kidney:  Measures 12.4 cm.  Parenchymal echogenicity is normal.  No hydronephrosis.  No focal lesions.   Left Kidney:  Measures 12.3 cm.  Parenchymal echogenicity is normal.  No hydronephrosis.  No focal lesions.  Abdominal aorta:  No aneurysm identified.   IMPRESSION:  1. Granular cholelithiasis with slight wall thickening.  Absent sonographic Murphy's sign.  Findings can be seen with chronic cholecystitis. 2.  Hepatic steatosis.   Ct Abdomen Pelvis W Contrast 02/23/2012  FINDINGS: Lung bases show dependent atelectasis and/or scarring. Heart size within normal limits.  No pericardial or pleural effusion.  Small hiatal hernia.  Liver, gallbladder, adrenal glands, kidneys and spleen are unremarkable.  The pancreas is hazy in appearance with surrounding inflammatory stranding and fluid.  The gland is uniform in attenuation, without areas of necrosis or organized fluid.  Stomach and bowel are unremarkable.  No pathologically enlarged lymph nodes.  No free fluid.  No worrisome lytic or sclerotic lesions.   IMPRESSION: Acute pancreatitis without complicating feature.  Dg Chest 2 View 02/26/2012   FINDINGS: Blunting of the costophrenic angles is compatible with the presence of very small effusions.  No consolidative process, pneumothorax or effusion is identified.  Heart size is mildly enlarged.   IMPRESSION: Very small bilateral pleural effusions.  No consolidative process is identified.  Laparoscopic cholecystectomy 02/27/2012  Dg Cholangiogram Operative 02/27/2012  *RADIOLOGY REPORT*  Clinical Data:   Chole lithiasis, pancreatitis.  INTRAOPERATIVE CHOLANGIOGRAM  Technique:  Cholangiographic images from the C-arm fluoroscopic device were submitted for interpretation post-operatively.  Please see the procedural report for the amount of contrast and the fluoroscopy time utilized.  Comparison:  None  Findings:  No persistent filling defects in the common duct. Intrahepatic ducts are incompletely visualized, appearing decompressed centrally. Contrast passes into the duodenum.  IMPRESSION  Negative for retained  common duct stone.   Original Report Authenticated By: Osa Craver, M.D.     ADMISSION DATA: H&P: This is a 56 year old woman with asthma and GERD, presenting with mid-abdominal pain radiating to the flanks bilaterally. Onset was the middle of the night last night. The patient woke up with abdominal pain and vomiting, but was able to fall back asleep. When she woke up this morning, the pain was intolerable, and she had her daughter bring her to the ED. She has had six episodes of emesis this morning and an episode of diarrhea yesterday. On review of systems, she denies dyspnea, chest pain, fever, chills, dysuria, hematuria, constipation, headache, dizziness, and weakness. She had a similar bout  of pain and vomiting a couple of months ago but that episode lasted on a couple of hours.  Physical Exam: Vitals: VITALS: Blood pressure 110/57, pulse 63, temperature 97.6 F (36.4 C), temperature source Oral, resp. rate 16, SpO2 95.00%.  GENERAL: Alert and oriented; in moderate distress from pain  HEAD: Atraumatic and normocephalic  EYES: Pupils are equal and reactive to light; sclera anicteric  ENT: Oropharynx is clear and mucous membranes moist  LUNGS: Clear to auscultation bilaterally; normal work of breathing  HEART: Regular rate and rhythm; normal S1 and S2; no murmur, rubs, gallops or clicks  PULSES: DP and PT pulses 2+ and equal bilaterally  ABDOMEN: Diffusely tender but especially in the epigastrium; absent bowel sounds; no rigidity or guarding  EXTREMITIES: No peripheral edema; extremities warm and well perfused   Labs: Basic Metabolic Panel:  Basename  02/23/12 0946  02/23/12 0932   NA  142  139   K  3.4*  3.4*   CL  101  102   CO2  --  28   GLUCOSE  133*  137*   BUN  16  15   CREATININE  1.00  0.78   CALCIUM  --  9.5   MG  --  --   PHOS  --  --    Liver Function Tests:  [facility]  02/23/12 0932   AST  309*   ALT  210*   ALKPHOS  98   BILITOT  0.6   PROT  7.5     ALBUMIN  3.6    Basename  02/23/12 0932   LIPASE  >3000*   AMYLASE  --   LDH  474    CBC:  Basename  02/23/12 0946  02/23/12 0932   WBC  --  16.0*   NEUTROABS  --  11.6*   HGB  14.6  13.9   HCT  43.0  41.8   MCV  --  87.6   PLT  --  274    Urinalysis:  Basename  02/23/12 1109   COLORURINE  YELLOW   LABSPEC  1.018   PHURINE  5.5   GLUCOSEU  NEGATIVE   HGBUR  NEGATIVE   BILIRUBINUR  NEGATIVE   KETONESUR  NEGATIVE   PROTEINUR  NEGATIVE   UROBILINOGEN  1.0   NITRITE  NEGATIVE   LEUKOCYTESUR  SMALL*     HOSPITAL COURSE: 1.   Acute pancreatitis:  Pt initially presented to the ED with this on 10/21 with a lipase >3000 and CT showing pancreatic inflammation. Treatment included analgesia, bowel rest, and iv fluids. Analgesia was attempted initially with acetaminophen, ketorolac, and hydromorphone. After receiving hydromorphone in the ED she desaturated to 87% and so was switched to morphine. Morphine was inadequate and so the pt was put back on hydromorphone with careful observation. Lipase corrected on last day of hospital stay to 53 and pain regimen was switched to scheduled acetaminophen, oxycodone prn, and hydromorphone for break through pain.  2.   Chronic cholecystitis:  Shown on [place] in the ED 10/21. Laparoscopic cholecystectomy performed 10/25 with no complications.  3.   Pyelonephritis:  Pt complained of bilateral flank pain and dark urine 10/23. Clean catch urinalysis showed bacteria and empiric tx with a 3 day course of ceftriaxone was started and finished during this stay. The patient's back pain resolved and she remained afebrile.  4.   Otitis externa:  Pt began complain of left ear pain 10/23 ear canals were erythematous bilaterally and pain  with movement of pinna. Pt was started on a 7 day course of ciprofloxacin/dexamethasone drops completing 4 days with the plan to finish the course at home.  5.   Morbid obesity:  Pt's admitting weight was 113.1 kg (BMI 44).  6.    Hypokalemia:  Pt was noted to have low K+ at admission 3.4, the level dropped to 3.2 10/22 and replacement via maintenance fluids was started. The K+ subsequently corrected, and remained normal throughout the rest of this admission.   DISCHARGE DATA: Vital Signs: BP 123/69  Pulse 68  Temp 98.1 F (36.7 C) (Oral)  Resp 18  Ht 5\' 2"  (1.575 m)  Wt 269 lb 11.2 oz (122.335 kg)  BMI 49.33 kg/m2  SpO2 96%  Labs: Results for orders placed during the hospital encounter of 02/23/12 (from the past 24 hour(s))  COMPREHENSIVE METABOLIC PANEL     Status: Abnormal   Collection Time   02/28/12  6:50 AM      Component Value Range   Sodium 139  135 - 145 mEq/L   Potassium 3.7  3.5 - 5.1 mEq/L   Chloride 102  96 - 112 mEq/L   CO2 30  19 - 32 mEq/L   Glucose, Bld 105 (*) 70 - 99 mg/dL   BUN 10  6 - 23 mg/dL   Creatinine, Ser 1.61  0.50 - 1.10 mg/dL   Calcium 9.3  8.4 - 09.6 mg/dL   Total Protein 6.7  6.0 - 8.3 g/dL   Albumin 2.8 (*) 3.5 - 5.2 g/dL   AST 39 (*) 0 - 37 U/L   ALT 63 (*) 0 - 35 U/L   Alkaline Phosphatase 106  39 - 117 U/L   Total Bilirubin 0.3  0.3 - 1.2 mg/dL   GFR calc non Af Amer >90  >90 mL/min   GFR calc Af Amer >90  >90 mL/min  CBC     Status: Abnormal   Collection Time   02/28/12  6:50 AM      Component Value Range   WBC 12.0 (*) 4.0 - 10.5 K/uL   RBC 3.58 (*) 3.87 - 5.11 MIL/uL   Hemoglobin 10.7 (*) 12.0 - 15.0 g/dL   HCT 04.5 (*) 40.9 - 81.1 %   MCV 88.5  78.0 - 100.0 fL   MCH 29.9  26.0 - 34.0 pg   MCHC 33.8  30.0 - 36.0 g/dL   RDW 91.4  78.2 - 95.6 %   Platelets 242  150 - 400 K/uL  LIPASE, BLOOD     Status: Normal   Collection Time   02/28/12  6:50 AM      Component Value Range   Lipase 53  11 - 59 U/L    Time spent on discharge: 35 minutes   Signed by:  Dorthula Rue. Earlene Plater, MD PGY-I, Internal Medicine  03/01/2012, 10:18 AM

## 2012-02-28 NOTE — Progress Notes (Signed)
Patient ID: Mary Daniels, female   DOB: October 08, 1955, 56 y.o.   MRN: [id number] Medical Student Daily Progress Note   Subjective:    Interval Events:   No acute events overnight she slept well.  Pt is now s/p laparoscopic cholecystectomy 1 day.  Per patient she has been trying to space out her hydromorphone doses as long as she can (averages 3-5 hrs between doses), she attempted to forgo hydromorphone this morning with a high dose of acetaminophen she was able to tolerate the pain for approximately 1 hr after getting the acetaminophen before requiring hydromorphone (making time between doses 4 hours 0221 and 0635).  Her ear pain has resolved.  Abdominal pain persists but has changed in character describing it more as a 5/10 "discomfort" and she attributes it more to her surgical incisions than to the visceral pain she was feeling before. Denies BM/flatus since yesterday.  Urinating with no problems no changes in color.    ROS: Denies HA, dizziness, fever, chills, runny nose, cough, sore throat, SOB, chest pain, dysuria, BM, flatus, numbness or tingling, and weakness.  Endorses abdominal pain and swelling of the lower extremities    Objective:    Vital Signs:   Temp:  [96.8 F (36 C)-99 F (37.2 C)] 99 F (37.2 C) (10/26 0541) Pulse Rate:  [64-80] 72  (10/26 0541) Resp:  [13-40] 16  (10/26 0541) BP: (132-172)/(63-81) 133/64 mmHg (10/26 0541) SpO2:  [92 %-98 %] 98 % (10/26 0541) Weight:  [122.335 kg (269 lb 11.2 oz)] 122.335 kg (269 lb 11.2 oz) (10/26 0500) Last BM Date: 02/27/12   Weights: 24-hour Weight change: 4.335 kg (9 lb 8.9 oz)  Filed Weights   02/26/12 0538 02/27/12 0438 02/28/12 0500  Weight: 116.4 kg (256 lb 9.9 oz) 118 kg (260 lb 2.3 oz) 122.335 kg (269 lb 11.2 oz)   Net since admission:  9.24 kg (20.72 lbs)  Intake/Output:   Intake/Output Summary (Last 24 hours) at 02/28/12 1025 Last data filed at 02/28/12 0934  Gross per 24 hour  Intake   2455 ml  Output      0  ml  Net   2455 ml     Net since admission:  7948.42   Physical Exam: GENERAL:  alert and oriented; resting comfortably in bed and in no distress EYES:  pupils equal, round, and reactive to light; sclera anicteric, conjunctivae show no pallor ENT:  moist mucosa, nares are clear bilaterally crusting and blood has resolved, throat is clean and non-erythematous NECK:  no JVD, lymphadenopathy has resolved LUNGS:  clear to auscultation bilaterally, normal work of breathing HEART:  normal rate; regular rhythm; normal S1 and S2, no S3 or S4 appreciated; no murmurs, rubs, or clicks ABDOMEN:  soft, mild abdominal tenderness to palpation in the mid epigastric region no guarding but grimacing on patient's face during palpation, normal bowel sounds, no masses palpated EXTREMITIES:  2+ pitting edema bilaterally in lower extremities (after 5 seconds of pressure edema resolved within 30 seconds) NEURO: EOMI, symmetric smile, sensation to light touch intact bilaterally throughout extremities and face, tongue protruded to midline, palate rises symetrically SKIN:  Dry, warm to touch, umbilical incision noted, two 1 cm incisions noted on right abdomen and one in the mid epigastric region all were covered with what appeared to be liquid bandage, surgical incisions non-erythematous and not warm to touch   Labs: Basic Metabolic Panel:  Lab 02/28/12 4098 02/27/12 0455 02/26/12 0545 02/25/12 0600 02/24/12 0625 02/23/12 0932  NA  139 140 140 143 138 --  K 3.7 3.3* 3.5 3.2* 3.4* --  CL 102 101 104 107 103 --  CO2 30 31 29 26 28  --  GLUCOSE 105* 94 94 79 103* --  BUN 10 11 13 13 11  --  CREATININE 0.58 0.70 0.68 0.69 0.69 --  CALCIUM 9.3 9.2 9.2 -- -- --  MG -- -- -- -- -- 1.9  PHOS -- -- -- -- -- --    Liver Function Tests:  Lab 02/28/12 0650 02/24/12 0625 02/23/12 0932  AST 39* 102* 309*  ALT 63* 159* 210*  ALKPHOS 106 91 98  BILITOT 0.3 0.6 0.6  PROT 6.7 6.5 7.5  ALBUMIN 2.8* 3.1* 3.6    Lab  02/28/12 0650 02/26/12 0545 02/25/12 0600 02/24/12 0625 02/23/12 0932  LIPASE 53 75* 141* 1290* >3000*  AMYLASE -- -- -- -- --   CBC:  Lab 02/28/12 0650 02/27/12 0455 02/24/12 0625 02/23/12 0946 02/23/12 0932  WBC 12.0* 12.9* 12.2* -- 16.0*  NEUTROABS -- -- -- -- 11.6*  HGB 10.7* 11.0* 12.3 14.6 13.9  HCT 31.7* 35.0* 37.1 43.0 41.8  MCV 88.5 90.4 88.1 -- 87.6  PLT 242 237 230 -- 274    CBG:  Lab 02/26/12 0751 02/25/12 0745 02/24/12 0749  GLUCAP 95 80 99    Microbiology: Results for orders placed during the hospital encounter of 02/23/12  CULTURE, BLOOD (ROUTINE X 2)     Status: Normal (Preliminary result)   Collection Time   02/23/12 10:35 AM      Component Value Range Status Comment   Specimen Description BLOOD HAND LEFT   Final    Special Requests BOTTLES DRAWN AEROBIC ONLY 3CC   Final    Culture  Setup Time 02/23/2012 18:24   Final    Culture     Final    Value:        BLOOD CULTURE RECEIVED NO GROWTH TO DATE CULTURE WILL BE HELD FOR 5 DAYS BEFORE ISSUING A FINAL NEGATIVE REPORT   Report Status PENDING   Incomplete   CULTURE, BLOOD (ROUTINE X 2)     Status: Normal (Preliminary result)   Collection Time   02/23/12 10:40 AM      Component Value Range Status Comment   Specimen Description BLOOD HAND RIGHT   Final    Special Requests BOTTLES DRAWN AEROBIC ONLY 5CC   Final    Culture  Setup Time 02/23/2012 18:24   Final    Culture     Final    Value:        BLOOD CULTURE RECEIVED NO GROWTH TO DATE CULTURE WILL BE HELD FOR 5 DAYS BEFORE ISSUING A FINAL NEGATIVE REPORT   Report Status PENDING   Incomplete   URINE CULTURE     Status: Normal   Collection Time   02/23/12 11:10 AM      Component Value Range Status Comment   Specimen Description URINE, RANDOM   Final    Special Requests NONE   Final    Culture  Setup Time 02/23/2012 11:40   Final    Colony Count 8,000 COLONIES/ML   Final    Culture INSIGNIFICANT GROWTH   Final    Report Status 02/24/2012 FINAL   Final     URINE CULTURE     Status: Normal   Collection Time   02/25/12 10:38 AM      Component Value Range Status Comment   Specimen Description URINE, CLEAN CATCH  Final    Special Requests NONE   Final    Culture  Setup Time 02/25/2012 16:51   Final    Colony Count 35,000 COLONIES/ML   Final    Culture     Final    Value: Multiple bacterial morphotypes present, none predominant. Suggest appropriate recollection if clinically indicated.   Report Status 02/26/2012 FINAL   Final   SURGICAL PCR SCREEN     Status: Normal   Collection Time   02/26/12  2:25 AM      Component Value Range Status Comment   MRSA, PCR NEGATIVE  NEGATIVE Final    Staphylococcus aureus NEGATIVE  NEGATIVE Final     Imaging: Dg Chest 2 View  02/26/2012  *RADIOLOGY REPORT*  Clinical Data: Productive cough.  CHEST - 2 VIEW  Comparison: Portable chest 02/23/2012 and PA and lateral chest 05/08/2009.  Findings: Blunting of the costophrenic angles is compatible with the presence of very small effusions.  No consolidative process, pneumothorax or effusion is identified.  Heart size is mildly enlarged.  IMPRESSION: Very small bilateral pleural effusions.  No consolidative process is identified.   Original Report Authenticated By: Bernadene Bell. D'ALESSIO, M.D.    Dg Cholangiogram Operative  02/27/2012  *RADIOLOGY REPORT*  Clinical Data:   Chole lithiasis, pancreatitis.  INTRAOPERATIVE CHOLANGIOGRAM  Technique:  Cholangiographic images from the C-arm fluoroscopic device were submitted for interpretation post-operatively.  Please see the procedural report for the amount of contrast and the fluoroscopy time utilized.  Comparison:  None  Findings:  No persistent filling defects in the common duct. Intrahepatic ducts are incompletely visualized, appearing decompressed centrally. Contrast passes into the duodenum.  IMPRESSION  Negative for retained common duct stone.   Original Report Authenticated By: Osa Craver, M.D.        Medications:    Scheduled Medications:    . cefTRIAXone (ROCEPHIN)  IV  1 g Intravenous Q24H  . ciprofloxacin-dexamethasone  4 drop Both Ears BID  . enoxaparin  40 mg Subcutaneous Q24H  . HYDROmorphone      . loratadine  10 mg Oral Daily  . montelukast  10 mg Oral Daily  . pantoprazole  40 mg Oral Daily  . senna-docusate  1 tablet Oral BID  . sodium chloride  3 mL Intravenous Q12H  . DISCONTD: chlorhexidine  1 application Topical Once  . DISCONTD: chlorhexidine  1 application Topical Once  . DISCONTD: enoxaparin (LOVENOX) injection  40 mg Subcutaneous Q24H  . DISCONTD: ketorolac  30 mg Intravenous Q6H     PRN Medications: acetaminophen, albuterol, diphenhydrAMINE, HYDROmorphone (DILAUDID) injection, ondansetron (ZOFRAN) IV, ondansetron, DISCONTD: 0.9 % irrigation (POUR BTL), DISCONTD: bupivacaine, DISCONTD: bupivacaine-EPINEPHrine, DISCONTD:  HYDROmorphone (DILAUDID) injection, DISCONTD: metoCLOPramide, DISCONTD: Omnipaque 300 mg/mL (50 mL) in 0.9% normal saline (50 mL), DISCONTD: oxyCODONE, DISCONTD: oxyCODONE, DISCONTD: sodium chloride irrigation    Assessment/ Plan:    Ms. Robb is a pleasant 56 yo woman with a PMHx significant for morbid obesity, asthma, and surgically repaired Schatzki's ring on hospital day #5 and post-op day #1 from laparoscopic cholecystectomy to treat chronic cholecystitis and prevent further episodes of pancreatitis. Pt has completed 3 day course of ceftriaxone for presumed pyelonephritis and day 4 of 7 ciprofloxacin/dexamethasone for otitis externa.   1. Acute pancreatitis:  -Abdominal pain has decreased to a level the patient thinks is manageable, complains more of pain at incision sites, back pain has resolved.  Lipase is normal at 53.  -Tylenol scheduled for every 6 hours. Oxycodone 5-10  mg every 4 hrs prn for analgesia -Hydromorphone for breakthrough pain -Zofran for nausea prn -Pt tolerating normal diet at this time.   2. Chronic  Cholecystitis:  -Given ultrasound results (cholelithiasis and wall thickening) cholecystitis is a possible cause of this patient's pancreatitis. Pt is s/p laparoscopic cholecystectomy.   3. Pyelonephritis:  -Pt has finished ceftriaxone course and no longer complains of flank pain or dysuria.   4. Hypotension:  -Has resolved. Continue to monitor with vital signs q8 hours.   5. Hypoxia:  -Likely due to aggressive fluid resuscitation. Pt's fluids have been cut to below maintenance and was taken off of oxygen yesterday, has had normal range O2 saturations since. Would like to dc iv fluids given pt's ability to tolerate PO.   6. Otitis Externa:  -Pt does not complain of pain today, ear canals are non-erythematous on exam.  -Day 4 of 7 as of 1200 continue ciprofloxacin/dexamethasone eardrops, pt can finish the course at home.   7. Itching:  -Began shortly after starting ceftriaxone making it the likely cause. Has since resolved with Benadryl. Will continue Benadryl prn.   8. Sinusitis:  -Pt no longer complains of productive cough and has remained afebrile throughout admission.  No need for treatment.   9. Rash:  -Has not changed since yesterday. No signs of infection, does not have appearance of fungal skin infection, likely dry skin. Pt counseled to not scratch given potential for infection. No need for treatment at this time.   10. Asthma/seasonal Allergies:  -Stable. Continue home medications of albuterol as needed, monteleukast, and loratadine   11. Morbid Obesity:  -Weight gain of 4.9 kg since admission this is likely due to fluid retention secondary to pancreatitis and iv fluids. Pt is tolerating PO and iv fluids have been KVO'd. BMI 49.3   12. Hypokalemia:  -Potassium today is 3.7.  Pt is tolerating normal diet no further need for replacement.   13. Anemia:  -Likely due to dilutional effect of iv fluids. No conjunctival pallor on physical exam. IV fluids have been discontinued pt  is now on PO diet.   14. FEN/GI:  -Pt tolerating normal diet this morning (eggs, bagel, orange juice, and some coffee).  -Continue normal diet  15. Activity:  -Up with assistance  -Encourage ambulation will help to get rid of excess fluid  16. Dispo:   -D/C to home after patient has bowel movement         Length of Stay: 5 days   This is a Psychologist, occupational Note.  The care of the patient was discussed with Dr. Earlene Plater and the assessment and plan formulated with their assistance.  Please see their attached note or addendum for official documentation of the daily encounter.   Addendum to medical student daily progress note:  I have seen the patient and reviewed the daily progress note by Frederik Pear and discussed the care of the patient with him. See below for documentation of my findings, assessment, and plans.   Subjective:    Interval Events:  I agree with above documentation.  Pain improving.     Objective:    Vital Signs:  Above vital signs were reviewed.   Physical Exam: GENERAL:  alert and oriented; resting comfortably in bed and in no distress LUNGS:  clear to auscultation bilaterally, normal work of breathing HEART:  normal rate; regular rhythm; normal S1 and S2, no S3 or S4 appreciated; no murmurs, rubs, or clicks ABDOMEN:  soft, mildly tender with palpation of  the epigastrium and surgical sites, surgical sites are healing well, normal bowel sounds SKIN:  normal turgor     Labs:  Above labs and radiographic studies were reviewed.    Assessment/ Plan:    I agree with the above assessment and plan.  We will discharge home once she has a bowel movement.  She is tolerating a full diet and pain is being controlled with oral analgesics now.    Length of Stay: 5 days   Signed by:  Dorthula Rue. Earlene Plater, MD PGY-I, Internal Medicine Pager [phone]  02/28/2012, 1:02 PM

## 2012-02-28 NOTE — Progress Notes (Signed)
Pt given discharge instructions with Rx given with copy of instructions.  Clearly understands them.  Husband with her.  Able to leave with husband via wheelchair for home.  Incisions of abd area dry and intact.  Pt alert oriented, IV left hand d/c without problem.  Pt able to dress self.  Tele d/c.

## 2012-02-28 NOTE — Progress Notes (Signed)
1 Day Post-Op   Assessment: s/p Procedure(s): LAPAROSCOPIC CHOLECYSTECTOMY WITH INTRAOPERATIVE CHOLANGIOGRAM Patient Active Problem List  Diagnosis  . ASTHMA  . Acute pancreatitis  . Chronic cholecystitis  . Cholelithiasis  . Obesity, Class III, BMI 40-49.9 (morbid obesity)  . Hypokalemia    Improved s/p lap chole; can go home from surgical standpoint  Plan: Discharge When ok with primary care; will need to see in office in three weeks. Does not need antibiotic from our standpoint  Subjective: Feels much better;pain improved, tol solid diet, no BM yet  Objective: Vital signs in last 24 hours: Temp:  [96.8 F (36 C)-99 F (37.2 C)] 99 F (37.2 C) (10/26 0541) Pulse Rate:  [64-80] 72  (10/26 0541) Resp:  [13-40] 16  (10/26 0541) BP: (132-172)/(63-81) 133/64 mmHg (10/26 0541) SpO2:  [92 %-98 %] 98 % (10/26 0541) Weight:  [269 lb 11.2 oz (122.335 kg)] 269 lb 11.2 oz (122.335 kg) (10/26 0500)   Intake/Output from previous day: 10/25 0701 - 10/26 0700 In: 2215 [P.O.:240; I.V.:1975] Out: -  Intake/Output this shift: Total I/O In: 240 [P.O.:240] Out: -    General appearance: alert and no distress GI: soft, non-tender; bowel sounds normal; no masses,  no organomegaly  Incision: healing well  Lab Results:   Basename 02/28/12 0650 02/27/12 0455  WBC 12.0* 12.9*  HGB 10.7* 11.0*  HCT 31.7* 35.0*  PLT 242 237   BMET  Basename 02/28/12 0650 02/27/12 0455  NA 139 140  K 3.7 3.3*  CL 102 101  CO2 30 31  GLUCOSE 105* 94  BUN 10 11  CREATININE 0.58 0.70  CALCIUM 9.3 9.2   PT/INR No results found for this basename: LABPROT:2,INR:2 in the last 72 hours ABG No results found for this basename: PHART:2,PCO2:2,PO2:2,HCO3:2 in the last 72 hours  MEDS, Scheduled    . cefTRIAXone (ROCEPHIN)  IV  1 g Intravenous Q24H  . ciprofloxacin-dexamethasone  4 drop Both Ears BID  . enoxaparin  40 mg Subcutaneous Q24H  . HYDROmorphone      . loratadine  10 mg Oral Daily    . montelukast  10 mg Oral Daily  . pantoprazole  40 mg Oral Daily  . senna-docusate  1 tablet Oral BID  . sodium chloride  3 mL Intravenous Q12H  . DISCONTD: chlorhexidine  1 application Topical Once  . DISCONTD: chlorhexidine  1 application Topical Once  . DISCONTD: enoxaparin (LOVENOX) injection  40 mg Subcutaneous Q24H  . DISCONTD: ketorolac  30 mg Intravenous Q6H    Studies/Results: Dg Chest 2 View  02/26/2012  *RADIOLOGY REPORT*  Clinical Data: Productive cough.  CHEST - 2 VIEW  Comparison: Portable chest 02/23/2012 and PA and lateral chest 05/08/2009.  Findings: Blunting of the costophrenic angles is compatible with the presence of very small effusions.  No consolidative process, pneumothorax or effusion is identified.  Heart size is mildly enlarged.  IMPRESSION: Very small bilateral pleural effusions.  No consolidative process is identified.   Original Report Authenticated By: Bernadene Bell. D'ALESSIO, M.D.    Dg Cholangiogram Operative  02/27/2012  *RADIOLOGY REPORT*  Clinical Data:   Chole lithiasis, pancreatitis.  INTRAOPERATIVE CHOLANGIOGRAM  Technique:  Cholangiographic images from the C-arm fluoroscopic device were submitted for interpretation post-operatively.  Please see the procedural report for the amount of contrast and the fluoroscopy time utilized.  Comparison:  None  Findings:  No persistent filling defects in the common duct. Intrahepatic ducts are incompletely visualized, appearing decompressed centrally. Contrast passes into the duodenum.  IMPRESSION  Negative for retained common duct stone.   Original Report Authenticated By: Osa Craver, M.D.       LOS: 5 days     Currie Paris, MD, [facility], [place] [phone]   02/28/2012 10:33 AM

## 2012-02-29 LAB — CULTURE, BLOOD (ROUTINE X 2)
Culture: NO GROWTH
Culture: NO GROWTH

## 2012-03-01 ENCOUNTER — Encounter (HOSPITAL_COMMUNITY): Payer: Self-pay | Admitting: Surgery

## 2012-03-19 ENCOUNTER — Encounter (INDEPENDENT_AMBULATORY_CARE_PROVIDER_SITE_OTHER): Payer: Self-pay | Admitting: Surgery

## 2012-03-19 ENCOUNTER — Ambulatory Visit (INDEPENDENT_AMBULATORY_CARE_PROVIDER_SITE_OTHER): Payer: BC Managed Care – PPO | Admitting: Surgery

## 2012-03-19 VITALS — BP 126/80 | HR 84 | Temp 98.5°F | Resp 16 | Ht 62.5 in | Wt 241.0 lb

## 2012-03-19 DIAGNOSIS — Z09 Encounter for follow-up examination after completed treatment for conditions other than malignant neoplasm: Secondary | ICD-10-CM

## 2012-03-19 NOTE — Progress Notes (Signed)
NAME: Mary Daniels       DOB: 1955/07/07           DATE: 03/19/2012       ZOX:[id number]   CC: Postop laparoscopic cholecystectomy  Impression:  The patient appears to be doing well, with improvement in her symptoms.  Plan:  She may resume full activity and regular diet. She  will followup with [place] on a p.r.n. basis. I did tell her that she may still have some foods that cause indigestion and ask her to call us if there are any questions, problems or concerns.  HPI:  This patient underwent a laparoscopic cholecystectomy with operative cholangiogram on 02/27/2012. She is in for her first postoperative visit. She notes that her incisional pain has resolved. Her preoperative symptoms have improved. She is not having problems with nausea, vomiting, diarrhea, fevers, chills, or urinary symptoms. She is tolerating diet. She feels that she is progressing well and nearly back to normal. PE:  VS: BP 126/80  Pulse 84  Temp 98.5 F (36.9 C) (Temporal)  Resp 16  Ht 5' 2.5" (1.588 m)  Wt 241 lb (109.317 kg)  BMI 43.38 kg/m2  General: The patient is alert and appears comfortable, NAD.  Abdomen: Soft and benign. The incisions are healing nicely. There are no apparent problems.  Data reviewed: IOC: Findings: No persistent filling defects in the common duct.  Intrahepatic ducts are incompletely visualized, appearing  decompressed centrally. Contrast passes into the duodenum.  IMPRESSION  Negative for retained common duct stone.   Pathology: Diagnosis Gallbladder - MILD CHRONIC CHOLECYSTITIS AND CHOLELITHIASIS. - ONE BENIGN LYMPH NODE (0/1). - NO TUMOR SEEN. Zandra Abts MD Pathologist, Electronic Signature (Case signed 03/01/2012)

## 2012-03-19 NOTE — Patient Instructions (Signed)
You may resume all normal activities We will see you again on an as needed basis. Please call the office at [phone] if you have any questions or concerns. Thank you for allowing us to take care of you.  

## 2012-08-09 ENCOUNTER — Other Ambulatory Visit (HOSPITAL_COMMUNITY): Payer: Self-pay | Admitting: Orthopedic Surgery

## 2012-08-09 DIAGNOSIS — M25561 Pain in right knee: Secondary | ICD-10-CM

## 2012-08-12 ENCOUNTER — Ambulatory Visit (HOSPITAL_COMMUNITY)
Admission: RE | Admit: 2012-08-12 | Discharge: 2012-08-12 | Disposition: A | Discharge status: Home / Self Care | Payer: BC Managed Care – PPO | Source: Ambulatory Visit | Attending: Orthopedic Surgery | Admitting: Orthopedic Surgery

## 2012-08-12 DIAGNOSIS — M1710 Unilateral primary osteoarthritis, unspecified knee: Secondary | ICD-10-CM | POA: Insufficient documentation

## 2012-08-12 DIAGNOSIS — M898X9 Other specified disorders of bone, unspecified site: Secondary | ICD-10-CM | POA: Insufficient documentation

## 2012-08-12 DIAGNOSIS — M25469 Effusion, unspecified knee: Secondary | ICD-10-CM | POA: Insufficient documentation

## 2012-08-12 DIAGNOSIS — IMO0002 Reserved for concepts with insufficient information to code with codable children: Secondary | ICD-10-CM | POA: Insufficient documentation

## 2012-08-12 DIAGNOSIS — M25561 Pain in right knee: Secondary | ICD-10-CM

## 2012-08-12 DIAGNOSIS — M25569 Pain in unspecified knee: Secondary | ICD-10-CM | POA: Insufficient documentation

## 2013-09-27 IMAGING — CR DG CHEST 2V
2 series · 2 of 2 positions shown · non-contrast
Comparison: Chest x-ray of 05/08/2009

CLINICAL DATA: Asthma, cough, wheezing

CHEST - 2 VIEW

[w chest pa]
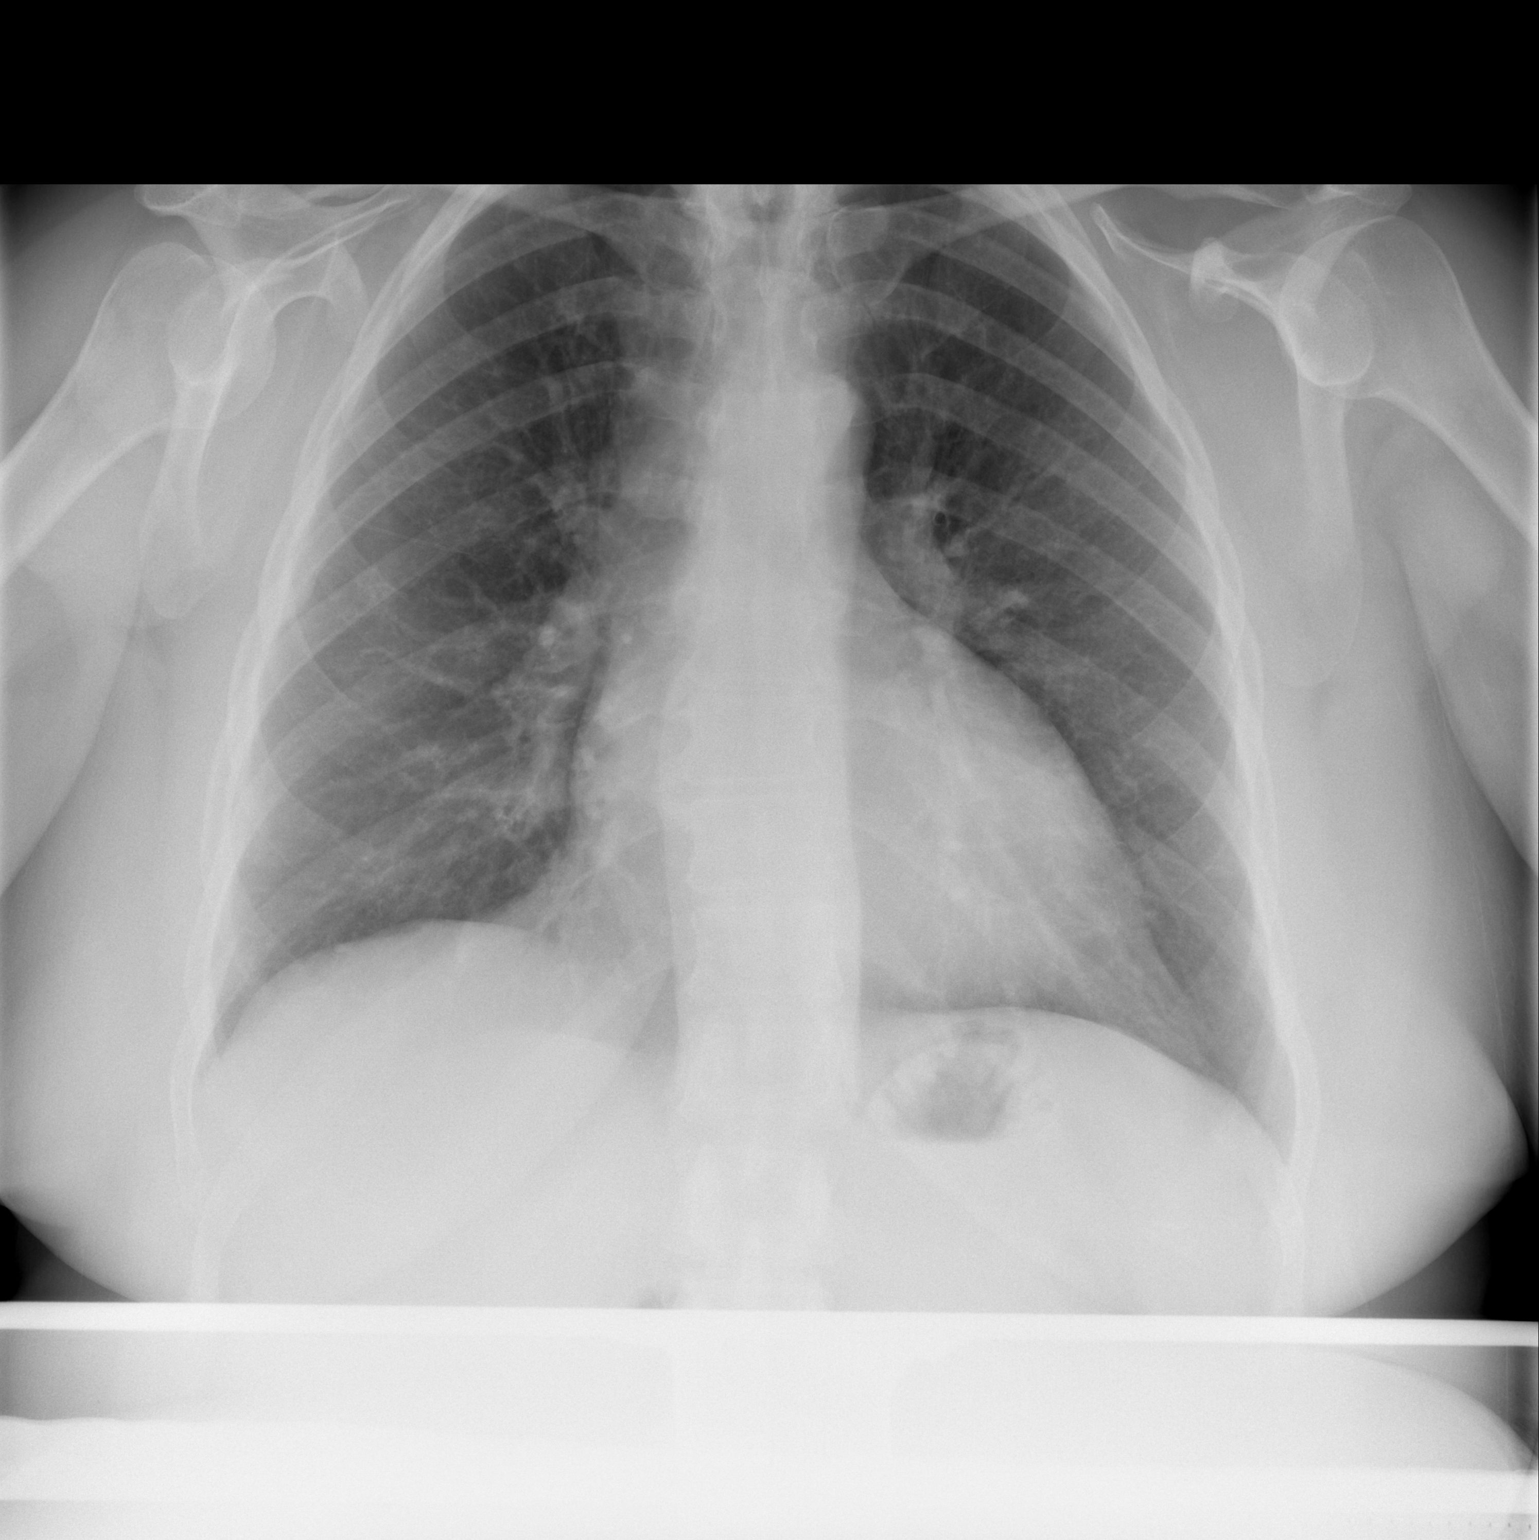

[w chest lat]
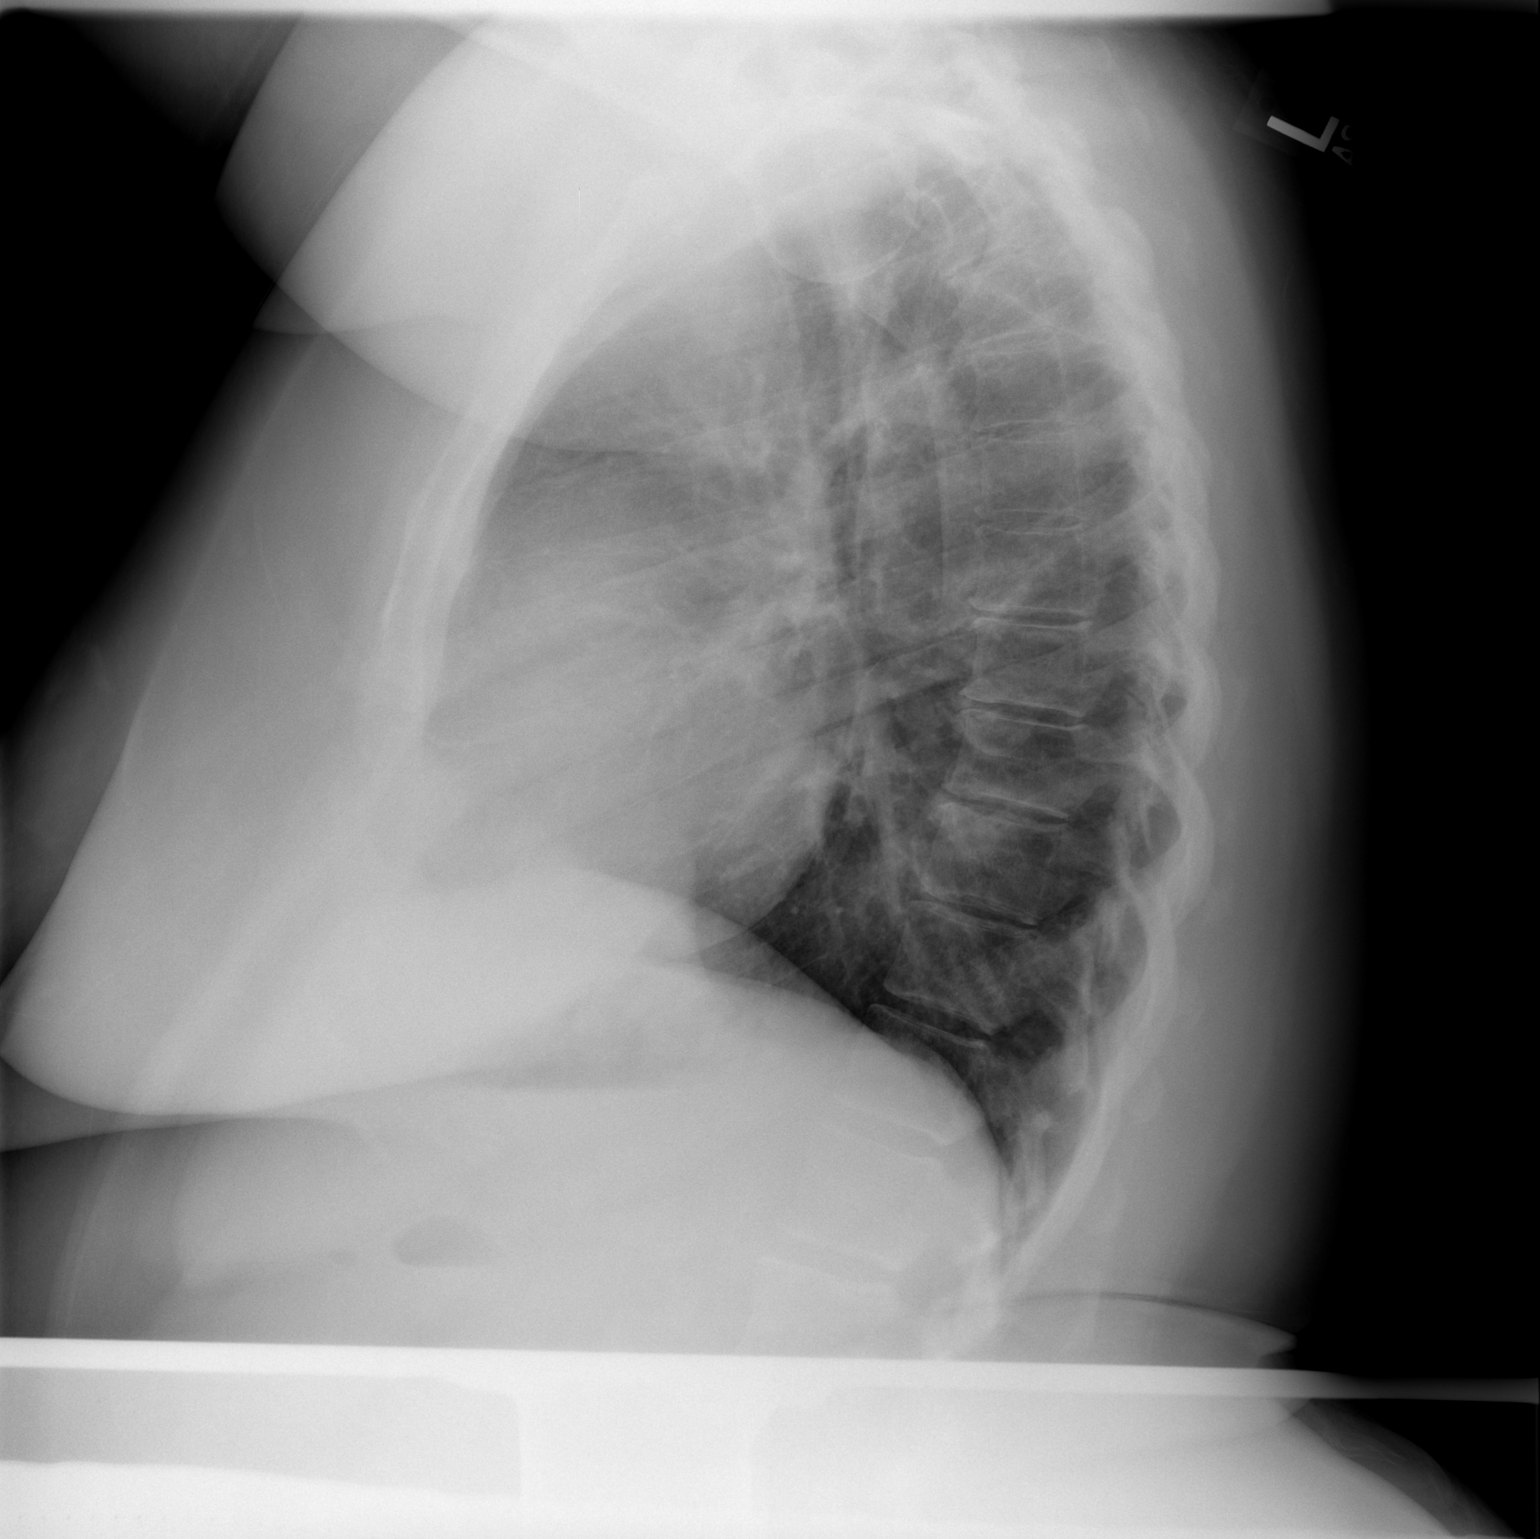

[2 of 2 positions shown; findings below may reference images not displayed]

FINDINGS: The lungs are clear.  The heart is mildly enlarged and
stable.  No acute bony abnormality is seen.
IMPRESSION: No active lung disease.  Mild cardiomegaly.

## 2016-08-05 ENCOUNTER — Emergency Department (HOSPITAL_BASED_OUTPATIENT_CLINIC_OR_DEPARTMENT_OTHER)
Admission: EM | Admit: 2016-08-05 | Discharge: 2016-08-05 | Disposition: A | Discharge status: Home / Self Care | Payer: BLUE CROSS/BLUE SHIELD | Attending: Emergency Medicine | Admitting: Emergency Medicine

## 2016-08-05 ENCOUNTER — Encounter (HOSPITAL_BASED_OUTPATIENT_CLINIC_OR_DEPARTMENT_OTHER): Payer: Self-pay

## 2016-08-05 DIAGNOSIS — R197 Diarrhea, unspecified: Secondary | ICD-10-CM | POA: Diagnosis present

## 2016-08-05 DIAGNOSIS — K529 Noninfective gastroenteritis and colitis, unspecified: Secondary | ICD-10-CM | POA: Diagnosis not present

## 2016-08-05 LAB — COMPREHENSIVE METABOLIC PANEL
ALBUMIN: 4.2 g/dL (ref 3.5–5.0)
ALK PHOS: 64 U/L (ref 38–126)
ALT: 40 U/L (ref 14–54)
AST: 25 U/L (ref 15–41)
Anion gap: 5 (ref 5–15)
BUN: 19 mg/dL (ref 6–20)
CHLORIDE: 103 mmol/L (ref 101–111)
CO2: 28 mmol/L (ref 22–32)
CREATININE: 0.82 mg/dL (ref 0.44–1.00)
Calcium: 9.1 mg/dL (ref 8.9–10.3)
GFR calc Af Amer: >60 mL/min (ref >60)
GFR calc non Af Amer: >60 mL/min (ref >60)
GLUCOSE: 87 mg/dL (ref 65–99)
Potassium: 3.1 mmol/L — ABNORMAL LOW (ref 3.5–5.1)
SODIUM: 136 mmol/L (ref 135–145)
Total Bilirubin: 0.6 mg/dL (ref 0.3–1.2)
Total Protein: 7.8 g/dL (ref 6.5–8.1)

## 2016-08-05 LAB — CBC
HCT: 44.3 % (ref 36.0–46.0)
HEMOGLOBIN: 14.9 g/dL (ref 12.0–15.0)
MCH: 30.2 pg (ref 26.0–34.0)
MCHC: 33.6 g/dL (ref 30.0–36.0)
MCV: 89.9 fL (ref 78.0–100.0)
PLATELETS: 259 10*3/uL (ref 150–400)
RBC: 4.93 MIL/uL (ref 3.87–5.11)
RDW: 12.6 % (ref 11.5–15.5)
WBC: 9.3 10*3/uL (ref 4.0–10.5)

## 2016-08-05 LAB — C DIFFICILE QUICK SCREEN W PCR REFLEX
C DIFFICILE (CDIFF) INTERP: NOT DETECTED
C DIFFICILE (CDIFF) TOXIN: NEGATIVE
C Diff antigen: NEGATIVE

## 2016-08-05 LAB — LIPASE, BLOOD: Lipase: 21 U/L (ref 11–51)

## 2016-08-05 MED ORDER — POTASSIUM CHLORIDE CRYS ER 20 MEQ PO TBCR: 20.0000 meq | EXTENDED_RELEASE_TABLET | Freq: Two times a day (BID) | ORAL | 0 refills | Status: AC

## 2016-08-05 MED ORDER — SODIUM CHLORIDE 0.9 % IV BOLUS (SEPSIS)
1000.0000 mL | Freq: Once | INTRAVENOUS | Status: AC
  Administered 2016-08-05: 1000 mL via INTRAVENOUS

## 2016-08-05 MED FILL — POTASSIUM CL ER 20 MEQ TABL: 20 | 5 days supply | Qty: 10 | Fill #0

## 2016-08-05 NOTE — ED Notes (Signed)
Stool sample sent to lab

## 2016-08-05 NOTE — ED Provider Notes (Signed)
[facility] Provider Note   [id number] Arrival date & time: 08/05/16  1205     History   Chief Complaint Chief Complaint  Patient presents with  . Emesis    HPI Mary Daniels is a 61 y.o. female who presents to the Emergency Department for diarrhea. She reports numerous episodes of watery stools daily for the last 6 days. No hematochezia or melena. She also complains of multiple episodes of emesis for 4 of the last 6 days and intermittent, non-radiating epigastric pain. No hematemesis, rash, dysuria, dyspnea, back pain, or fever. She has treated her symptoms with Rolaids at home with minimal relief. She reports she has been eating and drinking fluids at home, but she is not at her baseline. No sick contacts. No recent travel or camping.   PMH includes Schatzki's ring, GERD, and asthma. Daily medications include Prilosec. Surgical hx includes esophageal dilation, hysterectomy, and cholecystectomy. She denies alcohol use and is a never smoker.     HPI  Past Medical History:  Diagnosis Date  . Asthma in adult   . GERD (gastroesophageal reflux disease)   . Schatzki's ring    previous dilatation    Patient Active Problem List   Diagnosis Date Noted  . Acute pancreatitis 02/23/2012  . Obesity, Class III, BMI 40-49.9 (morbid obesity) (HCC) 02/23/2012  . Hypokalemia 02/23/2012  . ASTHMA 05/08/2009    Past Surgical History:  Procedure Laterality Date  . ABDOMINAL HYSTERECTOMY    . CHOLECYSTECTOMY  02/27/2012   Procedure: LAPAROSCOPIC CHOLECYSTECTOMY WITH INTRAOPERATIVE CHOLANGIOGRAM;  Surgeon: Currie Paris, MD;  Location: [facility];  Service: General;  Laterality: N/A;  . ESOPHAGEAL DILATION     Schatski's ring  . 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 Authorizing Provider  Budesonide-Formoterol Fumarate (SYMBICORT  IN) Inhale into the lungs.   Yes Historical Provider, MD  albuterol (PROVENTIL HFA;VENTOLIN HFA) 108 (90 BASE) MCG/ACT inhaler Inhale 2 puffs into the lungs every 6 (six) hours as needed. For wheezing    Historical Provider, MD  cetirizine (ZYRTEC) 10 MG tablet Take 10 mg by mouth daily.    Historical Provider, MD  Multiple Vitamins-Minerals (ONE-A-DAY 50 PLUS) TABS Take 1 tablet by mouth daily.    Historical Provider, MD  omeprazole (PRILOSEC) 20 MG capsule Take 20 mg by mouth daily.    Historical Provider, MD  potassium chloride SA (K-DUR,KLOR-CON) 20 MEQ tablet Take 1 tablet (20 mEq total) by mouth 2 (two) times daily. 08/05/16   Zaelynn Fuchs A Harleyquinn Gasser, PA-C    Family History Family History  Problem Relation Age of Onset  . Rheum arthritis Mother   . Cancer Father     COLON    Social History Social History  Substance Use Topics  . Smoking status: Never Smoker  . Smokeless tobacco: Never Used  . Alcohol use No     Allergies   Patient has no known allergies.   Review of Systems Review of Systems  Constitutional: Positive for appetite change and chills. Negative for fever.  Respiratory: Negative for shortness of breath.   Gastrointestinal: Positive for abdominal pain, diarrhea, nausea and vomiting.  Genitourinary: Negative for dysuria.  Musculoskeletal: Negative for back pain.  Skin: Negative for rash.  Allergic/Immunologic: Negative for immunocompromised state.  Neurological: Negative for headaches.  All  other systems reviewed and are negative.  Physical Exam Updated Vital Signs BP 115/72 (BP Location: Left Arm)   Pulse 72   Temp 98 F (36.7 C) (Oral)   Resp 14   Ht  (1.575 m)   Wt 112.9 kg   SpO2 98%   BMI 45.54 kg/m   Physical Exam  Constitutional: She is oriented to person, place, and time. She appears well-developed and well-nourished. No distress.  HENT:  Head: Normocephalic and atraumatic.  Eyes: Conjunctivae are normal.  Neck: Normal range of motion. Neck  supple.  Cardiovascular: Normal rate, regular rhythm and normal heart sounds.  Exam reveals no gallop and no friction rub.   No murmur heard. Pulmonary/Chest: Effort normal and breath sounds normal. No respiratory distress. She has no wheezes. She has no rales.  Abdominal: Soft. She exhibits no distension. Bowel sounds are increased. There is tenderness in the epigastric area. There is no rebound and no guarding.  Obese abdomen.   Musculoskeletal: Normal range of motion. She exhibits no edema.  Neurological: She is alert and oriented to person, place, and time.  Skin: Skin is warm and dry.  Psychiatric: She has a normal mood and affect.  Nursing note and vitals reviewed.    ED Treatments / Results  Labs (all labs ordered are listed, but only abnormal results are displayed) Labs Reviewed  COMPREHENSIVE METABOLIC PANEL - Abnormal; Notable for the following:       Result Value   Potassium 3.1 (*)    All other components within normal limits  C DIFFICILE QUICK SCREEN W PCR REFLEX  CBC  LIPASE, BLOOD    EKG  EKG Interpretation None       Radiology No results found.  Procedures Procedures (including critical care time)  Medications Ordered in ED Medications  sodium chloride 0.9 % bolus 1,000 mL (1,000 mLs Intravenous New Bag/Given 08/05/16 1537)     Initial Impression / Assessment and Plan / ED Course  I have reviewed the triage vital signs and the nursing notes.  Pertinent labs & imaging results that were available during my care of the patient were reviewed by me and considered in my medical decision making (see chart for details).     Patient with symptoms consistent with gastritis.  Likely viral in nature.  Vitals are stable, no fever or tachycardia.  Patient is nontoxic, nonseptic appearing, in no apparent distress.  Patient does not meet the SIRS or Sepsis criteria.  Pt's symptoms have been managed in the department; fluid bolus given. No signs of dehydration,  tolerating PO fluids > 6 oz.  Lungs are clear.  No peritoneal signs, no concern for appendicitis, cholecystitis, pancreatitis, ruptured viscus, UTI, kidney stone, PID, ectopic pregnancy.  Potassium slightly decreased at 3.1 today.  Will discharge to home with a short course of potassium chloride. Discussed return precautions with the patient. Patient counseled, expresses understanding and agrees with plan.  Final Clinical Impressions(s) / ED Diagnoses   Final diagnoses:  Gastroenteritis    New Prescriptions New Prescriptions   POTASSIUM CHLORIDE SA (K-DUR,KLOR-CON) 20 MEQ TABLET    Take 1 tablet (20 mEq total) by mouth 2 (two) times daily.     Barkley Boards, PA-C 08/05/16 1701    Azalia Bilis, MD 08/06/16 [phone]

## 2016-08-05 NOTE — ED Notes (Signed)
Discussed lab results not showing in system; lab results are apparently not crossing over to Epic at this time; the lab will print results for now.

## 2016-08-05 NOTE — ED Triage Notes (Signed)
c/o intermittent n/v/d x 5 days-NAD-steady gait

## 2022-06-29 ENCOUNTER — Emergency Department (HOSPITAL_BASED_OUTPATIENT_CLINIC_OR_DEPARTMENT_OTHER): Payer: Medicare Other

## 2022-06-29 ENCOUNTER — Encounter (HOSPITAL_BASED_OUTPATIENT_CLINIC_OR_DEPARTMENT_OTHER): Payer: Self-pay

## 2022-06-29 ENCOUNTER — Other Ambulatory Visit: Payer: Self-pay

## 2022-06-29 ENCOUNTER — Emergency Department (HOSPITAL_BASED_OUTPATIENT_CLINIC_OR_DEPARTMENT_OTHER)
Admission: EM | Admit: 2022-06-29 | Discharge: 2022-06-30 | Disposition: A | Discharge status: Home / Self Care | Payer: Medicare Other | Attending: Emergency Medicine | Admitting: Emergency Medicine

## 2022-06-29 DIAGNOSIS — R42 Dizziness and giddiness: Secondary | ICD-10-CM | POA: Insufficient documentation

## 2022-06-29 DIAGNOSIS — Z7951 Long term (current) use of inhaled steroids: Secondary | ICD-10-CM | POA: Diagnosis not present

## 2022-06-29 DIAGNOSIS — R519 Headache, unspecified: Secondary | ICD-10-CM | POA: Diagnosis not present

## 2022-06-29 DIAGNOSIS — M62838 Other muscle spasm: Secondary | ICD-10-CM | POA: Insufficient documentation

## 2022-06-29 DIAGNOSIS — J45909 Unspecified asthma, uncomplicated: Secondary | ICD-10-CM | POA: Diagnosis not present

## 2022-06-29 DIAGNOSIS — R110 Nausea: Secondary | ICD-10-CM | POA: Diagnosis not present

## 2022-06-29 DIAGNOSIS — M542 Cervicalgia: Secondary | ICD-10-CM | POA: Diagnosis present

## 2022-06-29 LAB — CBC WITH DIFFERENTIAL/PLATELET
Abs Immature Granulocytes: 0.02 10*3/uL (ref 0.00–0.07)
Basophils Absolute: 0 10*3/uL (ref 0.0–0.1)
Basophils Relative: 0 %
Eosinophils Absolute: 0.3 10*3/uL (ref 0.0–0.5)
Eosinophils Relative: 3 %
HCT: 40.7 % (ref 36.0–46.0)
Hemoglobin: 13.9 g/dL (ref 12.0–15.0)
Immature Granulocytes: 0 %
Lymphocytes Relative: 34 %
Lymphs Abs: 3.3 10*3/uL (ref 0.7–4.0)
MCH: 30.7 pg (ref 26.0–34.0)
MCHC: 34.2 g/dL (ref 30.0–36.0)
MCV: 89.8 fL (ref 80.0–100.0)
Monocytes Absolute: 0.7 10*3/uL (ref 0.1–1.0)
Monocytes Relative: 7 %
Neutro Abs: 5.4 10*3/uL (ref 1.7–7.7)
Neutrophils Relative %: 56 %
Platelets: 235 10*3/uL (ref 150–400)
RBC: 4.53 MIL/uL (ref 3.87–5.11)
RDW: 12.3 % (ref 11.5–15.5)
WBC: 9.7 10*3/uL (ref 4.0–10.5)
nRBC: 0 % (ref 0.0–0.2)

## 2022-06-29 LAB — BASIC METABOLIC PANEL
Anion gap: 4 — ABNORMAL LOW (ref 5–15)
BUN: 15 mg/dL (ref 8–23)
CO2: 28 mmol/L (ref 22–32)
Calcium: 11.9 mg/dL — ABNORMAL HIGH (ref 8.9–10.3)
Chloride: 106 mmol/L (ref 98–111)
Creatinine, Ser: 0.84 mg/dL (ref 0.44–1.00)
GFR, Estimated: >60 mL/min (ref >60)
Glucose, Bld: 120 mg/dL — ABNORMAL HIGH (ref 70–99)
Potassium: 3.4 mmol/L — ABNORMAL LOW (ref 3.5–5.1)
Sodium: 138 mmol/L (ref 135–145)

## 2022-06-29 LAB — TROPONIN I (HIGH SENSITIVITY)
Troponin I (High Sensitivity): 3 ng/L (ref <18)
Troponin I (High Sensitivity): 3 ng/L (ref <18)

## 2022-06-29 MED ORDER — IOHEXOL 350 MG/ML SOLN
75.0000 mL | Freq: Once | INTRAVENOUS | Status: AC | PRN
  Administered 2022-06-29: 75 mL via INTRAVENOUS

## 2022-06-29 MED ORDER — CYCLOBENZAPRINE HCL 10 MG PO TABS: 10.0000 mg | ORAL_TABLET | Freq: Two times a day (BID) | ORAL | 0 refills | Status: DC | PRN

## 2022-06-29 MED ORDER — CYCLOBENZAPRINE HCL 10 MG PO TABS: 10.0000 mg | ORAL_TABLET | Freq: Two times a day (BID) | ORAL | 0 refills | Status: AC | PRN

## 2022-06-29 MED ORDER — LIDOCAINE 5 % EX PTCH: 1.0000 | MEDICATED_PATCH | CUTANEOUS | 0 refills | Status: DC

## 2022-06-29 MED ORDER — LIDOCAINE 5 % EX PTCH: 1.0000 | MEDICATED_PATCH | CUTANEOUS | 0 refills | Status: AC

## 2022-06-29 MED ORDER — DIAZEPAM 5 MG/ML IJ SOLN
2.5000 mg | Freq: Once | INTRAMUSCULAR | Status: AC
  Administered 2022-06-29: 2.5 mg via INTRAVENOUS
  Filled 2022-06-29: qty 2

## 2022-06-29 MED ORDER — LIDOCAINE 5 % EX PTCH
1.0000 | MEDICATED_PATCH | CUTANEOUS | Status: DC
  Administered 2022-06-29: 1 via TRANSDERMAL
  Filled 2022-06-29: qty 1

## 2022-06-29 NOTE — ED Triage Notes (Signed)
Pt arrives with c/o neck pain that started about 5 days ago. Per pt, the pain has started to radiates into her back and head. Pt endorses dizziness and nausea. Pt denies vision changes.

## 2022-06-29 NOTE — ED Provider Notes (Signed)
[facility] Provider Note   CSN: [id number] Arrival date & time: 06/29/22  1757     History {Add pertinent medical, surgical, social history, OB history to HPI:1} Chief Complaint  Patient presents with   Neck pain     Mary Daniels is a 67 y.o. female.  HPI     Woke up Thursday morning, couldn't move head, neck, thought slept wrong, tried to exercise it Thursday. Went to work and when got home thought maybe needed massage to help ease the pain, tried Production assistant, radio and put it on there and was movint it and it was hurting so much. Saturday could turn head to right but can feel it when turning head to right. Day while eating felt so dizzy. When pain severe feels like the room spins. Close eyes and hold head. Doesn't seem like it is getting better and so wanted to come get it checked out, not sure if pulled muscle, hurt a nerve.    Feels like it catches at times. Radiates to head. Couldn't get laid down in bed to get comfortable. Pain with even small movements.  Tries to move head but can't even move it back.  Like an excrutiating pain. Neck shouldrs tl lfet side and radiates up.  First time it went into the front too.  Is under stress. No falls or jerks or whiplash.  Dizziness when the pain is severe feels nauseas. No change in vision, numbness, weakness, no difficulty talking or walking Feels like left ear is off or plugged needs to yawn, like echoing Worse with movement, coughing No shortness of breath except when pain comes on  Home Medications Prior to Admission medications   Medication Sig Start Date End Date Taking? Authorizing Provider  albuterol (PROVENTIL HFA;VENTOLIN HFA) 108 (90 BASE) MCG/ACT inhaler Inhale 2 puffs into the lungs every 6 (six) hours as needed. For wheezing    [provider]  Budesonide-Formoterol Fumarate (SYMBICORT IN) Inhale into the lungs.    [provider]  cetirizine (ZYRTEC) 10 MG tablet  Take 10 mg by mouth daily.    [provider]  Multiple Vitamins-Minerals (ONE-A-DAY 50 PLUS) TABS Take 1 tablet by mouth daily.    [provider]  omeprazole (PRILOSEC) 20 MG capsule Take 20 mg by mouth daily.    [provider]  potassium chloride SA (K-DUR,KLOR-CON) 20 MEQ tablet Take 1 tablet (20 mEq total) by mouth 2 (two) times daily. 08/05/16   McDonald, Mia A, PA-C      Allergies    Patient has no known allergies.    Review of Systems   Review of Systems  Physical Exam Updated Vital Signs BP (!) 165/84 (BP Location: Left Arm)   Pulse 84   Temp (!) 97.5 F (36.4 C) (Oral)   Resp 20   Wt 102.1 kg   SpO2 96%   BMI 41.15 kg/m  Physical Exam  ED Results / Procedures / Treatments   Labs (all labs ordered are listed, but only abnormal results are displayed) Labs Reviewed  BASIC METABOLIC PANEL - Abnormal; Notable for the following components:      Result Value   Potassium 3.4 (*)    Glucose, Bld 120 (*)    Calcium 11.9 (*)    Anion gap 4 (*)    All other components within normal limits  CBC WITH DIFFERENTIAL/PLATELET  TROPONIN I (HIGH SENSITIVITY)  TROPONIN I (HIGH SENSITIVITY)    EKG EKG Interpretation  Date/Time:  Sunday June 29 2022 18:40:29 EST Ventricular Rate:  76 PR Interval:  160 QRS Duration: 80 QT Interval:  338 QTC Calculation: 380 R Axis:   63 Text Interpretation: Sinus rhythm No significant change since last tracing Confirmed by Gareth Morgan [phone]) on 06/29/2022 6:54:52 PM  Radiology No results found.  Procedures Procedures  {Document cardiac monitor, telemetry assessment procedure when appropriate:1}  Medications Ordered in ED Medications - No data to display  ED Course/ Medical Decision Making/ A&P   {   Click here for ABCD2, HEART and other calculatorsREFRESH Note before signing :1}                          Medical Decision Making Amount and/or Complexity of Data Reviewed Labs:  ordered.   ***  {Document critical care time when appropriate:1} {Document review of labs and clinical decision tools ie heart score, Chads2Vasc2 etc:1}  {Document your independent review of radiology images, and any outside records:1} {Document your discussion with family members, caretakers, and with consultants:1} {Document social determinants of health affecting pt's care:1} {Document your decision making why or why not admission, treatments were needed:1} Final Clinical Impression(s) / ED Diagnoses Final diagnoses:  None    Rx / DC Orders ED Discharge Orders     None

## 2023-04-10 ENCOUNTER — Emergency Department (HOSPITAL_BASED_OUTPATIENT_CLINIC_OR_DEPARTMENT_OTHER): Payer: Medicare Other

## 2023-04-10 ENCOUNTER — Emergency Department (HOSPITAL_BASED_OUTPATIENT_CLINIC_OR_DEPARTMENT_OTHER)
Admission: EM | Admit: 2023-04-10 | Discharge: 2023-04-11 | Disposition: A | Discharge status: Home / Self Care | Payer: Medicare Other | Attending: Emergency Medicine | Admitting: Emergency Medicine

## 2023-04-10 ENCOUNTER — Other Ambulatory Visit: Payer: Self-pay

## 2023-04-10 ENCOUNTER — Encounter (HOSPITAL_BASED_OUTPATIENT_CLINIC_OR_DEPARTMENT_OTHER): Payer: Self-pay | Admitting: Emergency Medicine

## 2023-04-10 DIAGNOSIS — N200 Calculus of kidney: Secondary | ICD-10-CM | POA: Insufficient documentation

## 2023-04-10 DIAGNOSIS — D72829 Elevated white blood cell count, unspecified: Secondary | ICD-10-CM | POA: Insufficient documentation

## 2023-04-10 DIAGNOSIS — M545 Low back pain, unspecified: Secondary | ICD-10-CM | POA: Diagnosis present

## 2023-04-10 LAB — COMPREHENSIVE METABOLIC PANEL
ALT: 27 U/L (ref 0–44)
AST: 21 U/L (ref 15–41)
Albumin: 3.5 g/dL (ref 3.5–5.0)
Alkaline Phosphatase: 72 U/L (ref 38–126)
Anion gap: 10 (ref 5–15)
BUN: 21 mg/dL (ref 8–23)
CO2: 26 mmol/L (ref 22–32)
Calcium: 9 mg/dL (ref 8.9–10.3)
Chloride: 102 mmol/L (ref 98–111)
Creatinine, Ser: 0.81 mg/dL (ref 0.44–1.00)
GFR, Estimated: >60 mL/min (ref >60)
Glucose, Bld: 108 mg/dL — ABNORMAL HIGH (ref 70–99)
Potassium: 3.5 mmol/L (ref 3.5–5.1)
Sodium: 138 mmol/L (ref 135–145)
Total Bilirubin: 0.5 mg/dL (ref <1.2)
Total Protein: 6.7 g/dL (ref 6.5–8.1)

## 2023-04-10 LAB — LIPASE, BLOOD: Lipase: 33 U/L (ref 11–51)

## 2023-04-10 LAB — CBC WITH DIFFERENTIAL/PLATELET
Abs Immature Granulocytes: 0.03 10*3/uL (ref 0.00–0.07)
Basophils Absolute: 0.1 10*3/uL (ref 0.0–0.1)
Basophils Relative: 1 %
Eosinophils Absolute: 0.2 10*3/uL (ref 0.0–0.5)
Eosinophils Relative: 2 %
HCT: 36.4 % (ref 36.0–46.0)
Hemoglobin: 12.3 g/dL (ref 12.0–15.0)
Immature Granulocytes: 0 %
Lymphocytes Relative: 21 %
Lymphs Abs: 2.4 10*3/uL (ref 0.7–4.0)
MCH: 30.1 pg (ref 26.0–34.0)
MCHC: 33.8 g/dL (ref 30.0–36.0)
MCV: 89.2 fL (ref 80.0–100.0)
Monocytes Absolute: 0.8 10*3/uL (ref 0.1–1.0)
Monocytes Relative: 7 %
Neutro Abs: 7.9 10*3/uL — ABNORMAL HIGH (ref 1.7–7.7)
Neutrophils Relative %: 69 %
Platelets: 241 10*3/uL (ref 150–400)
RBC: 4.08 MIL/uL (ref 3.87–5.11)
RDW: 12.7 % (ref 11.5–15.5)
WBC: 11.4 10*3/uL — ABNORMAL HIGH (ref 4.0–10.5)
nRBC: 0 % (ref 0.0–0.2)

## 2023-04-10 LAB — URINALYSIS, MICROSCOPIC (REFLEX)

## 2023-04-10 LAB — URINALYSIS, ROUTINE W REFLEX MICROSCOPIC
Bilirubin Urine: NEGATIVE
Glucose, UA: NEGATIVE mg/dL
Ketones, ur: NEGATIVE mg/dL
Nitrite: NEGATIVE
Protein, ur: NEGATIVE mg/dL
Specific Gravity, Urine: >=1.03 (ref 1.005–1.030)
pH: 6 (ref 5.0–8.0)

## 2023-04-10 LAB — TROPONIN I (HIGH SENSITIVITY): Troponin I (High Sensitivity): 4 ng/L (ref <18)

## 2023-04-10 MED ORDER — IOHEXOL 350 MG/ML SOLN
100.0000 mL | Freq: Once | INTRAVENOUS | Status: AC | PRN
  Administered 2023-04-10: 100 mL via INTRAVENOUS

## 2023-04-10 NOTE — ED Provider Notes (Signed)
[facility] Provider Note   CSN: [id number] Arrival date & time: 04/10/23  2035    History  Chief Complaint  Patient presents with   Back Pain    Mary Daniels is a 67 y.o. female here for evaluation of back pain.  Pain to right mid and lower back.  Patient states she was at church when she felt sudden onset stabbing pain.  Went through in her stomach felt "not right."  She subsequently got up and went to the bathroom became diaphoretic, lightheaded had episode of emesis as well as 3 episodes of loose stool without any blood.  She continued to have severe pain for like she had to be bent over.  She denies any recent injury or trauma.  She denies any actual syncopal event.  No headache, numbness, weakness, chest pain, shortness of breath, dysuria or hematuria.  States she does have a history of sciatica however the symptoms did not feel like that.  Symptoms did not go into her legs. Took tylenol at [facility]. Hx of pancreatitis.  HPI     Home Medications Prior to Admission medications   Medication Sig Start Date End Date Taking? Authorizing Provider  albuterol (PROVENTIL HFA;VENTOLIN HFA) 108 (90 BASE) MCG/ACT inhaler Inhale 2 puffs into the lungs every 6 (six) hours as needed. For wheezing    [provider]  Budesonide-Formoterol Fumarate (SYMBICORT IN) Inhale into the lungs.    [provider]  cetirizine (ZYRTEC) 10 MG tablet Take 10 mg by mouth daily.    [provider]  cyclobenzaprine (FLEXERIL) 10 MG tablet Take 1 tablet (10 mg total) by mouth 2 (two) times daily as needed for muscle spasms. 06/29/22   Alvira Monday, MD  lidocaine (LIDODERM) 5 % Place 1 patch onto the skin daily. Remove & Discard patch within 12 hours or as directed by MD 06/29/22   Alvira Monday, MD  Multiple Vitamins-Minerals (ONE-A-DAY 50 PLUS) TABS Take 1 tablet by mouth daily.    [provider]  omeprazole (PRILOSEC) 20 MG  capsule Take 20 mg by mouth daily.    [provider]  potassium chloride SA (K-DUR,KLOR-CON) 20 MEQ tablet Take 1 tablet (20 mEq total) by mouth 2 (two) times daily. 08/05/16   McDonald, Mia A, PA-C      Allergies    Patient has no known allergies.    Review of Systems   Review of Systems  Constitutional: Negative.   HENT: Negative.    Respiratory: Negative.    Cardiovascular: Negative.   Gastrointestinal:  Positive for abdominal pain, diarrhea, nausea and vomiting. Negative for abdominal distention, anal bleeding, blood in stool, constipation and rectal pain.  Genitourinary:  Positive for flank pain.  Musculoskeletal:  Positive for back pain.  Skin: Negative.   Neurological: Negative.   All other systems reviewed and are negative.  Physical Exam Updated Vital Signs BP (!) 106/57   Pulse 74   Temp 97.7 F (36.5 C) (Oral)   Resp 14   Ht 5' 2.5" (1.588 m)   Wt 105.2 kg   SpO2 93%   BMI 41.76 kg/m  Physical Exam Vitals and nursing note reviewed.  Constitutional:      General: She is not in acute distress.    Appearance: She is well-developed. She is not ill-appearing, toxic-appearing or diaphoretic.  HENT:     Head: Normocephalic and atraumatic.     Nose: Nose normal.     Mouth/Throat:  Mouth: Mucous membranes are moist.  Eyes:     Pupils: Pupils are equal, round, and reactive to light.  Cardiovascular:     Rate and Rhythm: Normal rate.     Pulses: Normal pulses.     Heart sounds: Normal heart sounds.  Pulmonary:     Effort: Pulmonary effort is normal. No respiratory distress.     Breath sounds: Normal breath sounds.  Abdominal:     General: Bowel sounds are normal. There is no distension.     Palpations: Abdomen is soft.     Tenderness: There is no abdominal tenderness. There is no guarding or rebound.  Musculoskeletal:        General: Tenderness present. No swelling, deformity or signs of injury. Normal range of motion.     Cervical back: Normal  range of motion.       Back:     Right lower leg: No edema.     Left lower leg: No edema.     Comments: No midline back pain.  Full range of motion without difficulty  Skin:    General: Skin is warm and dry.  Neurological:     General: No focal deficit present.     Mental Status: She is alert.  Psychiatric:        Mood and Affect: Mood normal.    ED Results / Procedures / Treatments   Labs (all labs ordered are listed, but only abnormal results are displayed) Labs Reviewed  URINALYSIS, ROUTINE W REFLEX MICROSCOPIC - Abnormal; Notable for the following components:      Result Value   Hgb urine dipstick MODERATE (*)    Leukocytes,Ua SMALL (*)    All other components within normal limits  CBC WITH DIFFERENTIAL/PLATELET - Abnormal; Notable for the following components:   WBC 11.4 (*)    Neutro Abs 7.9 (*)    All other components within normal limits  COMPREHENSIVE METABOLIC PANEL - Abnormal; Notable for the following components:   Glucose, Bld 108 (*)    All other components within normal limits  URINALYSIS, MICROSCOPIC (REFLEX) - Abnormal; Notable for the following components:   Bacteria, UA FEW (*)    Non Squamous Epithelial PRESENT (*)    All other components within normal limits  LIPASE, BLOOD  TROPONIN I (HIGH SENSITIVITY)  TROPONIN I (HIGH SENSITIVITY)    EKG None  Radiology DG Lumbar Spine Complete  Result Date: 04/10/2023 CLINICAL DATA:  Back pain EXAM: LUMBAR SPINE - COMPLETE 4+ VIEW COMPARISON:  None Available. FINDINGS: Mild dextrocurvature. Grade 1 anterolisthesis L4 on L5. Mild disc space narrowing at L4-L5 with mild to moderate disc space narrowing L2-L3 and L3-L4. Facet degenerative changes of the lower lumbar spine IMPRESSION: Mild dextrocurvature with grade 1 anterolisthesis L4 on L5. Mild to moderate degenerative changes. Electronically Signed   By: Jasmine Pang M.D.   On: 04/10/2023 21:15    Procedures Procedures    Medications Ordered in  ED Medications  iohexol (OMNIPAQUE) 350 MG/ML injection 100 mL (has no administration in time range)    ED Course/ Medical Decision Making/ A&P   Here for evaluation of right flank pain and lower back pain.  Earlier today developed sudden onset stabbing pain.  Felt like her stomach felt "off."  Subsequently developed diaphoresis, near syncope, multiple episodes of emesis as well as loose stool.  No prior history of kidney stones, AAA that she is aware of.  Pain has improved on arrival however still has pain.  Patient neurovascular  intact.  Does not appear grossly fluid overloaded.  She denies any headache, chest pain or shortness of breath.  History of sciatica however states this feels different.  No radicular symptoms.  While she does appear overall well given her symptoms we will plan on labs, imaging and reassess  Labs and imaging personally viewed and interpreted:  Lumbar x-ray ordered from triage without significant abnormality CBC leukocytosis at 11.4 UA with moderate blood, small leuks, WBC 21-50 CMP glucose 108 Lipase 33   Care transferred to oncoming provider who will FU on remaining labs, imaging and dispo. Suspect if labs and imaging without significant findings can dc home.                                Medical Decision Making Amount and/or Complexity of Data Reviewed Independent Historian:     Details: Family in room External Data Reviewed: labs, radiology, ECG and notes. Labs: ordered. Decision-making details documented in ED Course. Radiology: ordered and independent interpretation performed. Decision-making details documented in ED Course. ECG/medicine tests: ordered and independent interpretation performed. Decision-making details documented in ED Course.  Risk OTC drugs. Prescription drug management. Decision regarding hospitalization. Diagnosis or treatment significantly limited by social determinants of health.         Final Clinical Impression(s) /  ED Diagnoses Final diagnoses:  None    Rx / DC Orders ED Discharge Orders     None         Carla Whilden A, PA-C 04/10/23 2327    Geoffery Lyons, MD 04/11/23 0015

## 2023-04-10 NOTE — ED Triage Notes (Signed)
Pt presents to the ED via POV accompanied by family with complaints of lower back pain that started this evening. Pt endorses an "achy feeling" in her back rating it around 7/10. She last took Tylenol around 1900 without any improvement. A&Ox4 at this time. Denies Dizziness, urinary sx, CP or SOB.

## 2023-04-10 NOTE — ED Notes (Signed)
Pt. Reports back pain in the mid lower back this night and has now subsided.  Pt. Reports that she almost passed out from the pain but now has no pain.

## 2023-04-10 NOTE — ED Notes (Signed)
Pt to CT

## 2023-04-11 LAB — TROPONIN I (HIGH SENSITIVITY): Troponin I (High Sensitivity): 3 ng/L (ref <18)

## 2023-04-11 MED ORDER — HYDROCODONE-ACETAMINOPHEN 5-325 MG PO TABS
2.0000 | ORAL_TABLET | Freq: Once | ORAL | Status: AC
Start: 2023-04-11 — End: 2023-04-11
  Administered 2023-04-11: 2 via ORAL
  Filled 2023-04-11: qty 2

## 2023-04-11 MED ORDER — HYDROCODONE-ACETAMINOPHEN 5-325 MG PO TABS: 1.0000 | ORAL_TABLET | Freq: Four times a day (QID) | ORAL | 0 refills | Status: AC | PRN

## 2023-04-11 MED ORDER — KETOROLAC TROMETHAMINE 30 MG/ML IJ SOLN
30.0000 mg | Freq: Once | INTRAMUSCULAR | Status: AC
  Administered 2023-04-11: 30 mg via INTRAVENOUS
  Filled 2023-04-11: qty 1

## 2023-04-11 NOTE — ED Notes (Signed)
Discharge instructions regarding home care of kidney stones were reviewed. Pt was instructed to take pain medication, follow up with urology in 3-4 days if stone hasn't resolved, and to return with any new or worsening symptoms. Pt verbalized understanding. IV removed, catheter intact, and bleeding controlled

## 2023-04-11 NOTE — Discharge Instructions (Addendum)
Begin taking hydrocodone as prescribed as needed for pain.  If the stone has not passed in the next 3 to 4 days, follow-up with [facility].  Their contact information has been provided in this discharge summary for you to call and make these arrangements.  Return to the ER for worsening pain, high fevers, or for other new and concerning symptoms.

## 2023-08-21 ENCOUNTER — Other Ambulatory Visit: Payer: Self-pay | Admitting: Orthopedic Surgery

## 2023-08-21 DIAGNOSIS — M4316 Spondylolisthesis, lumbar region: Secondary | ICD-10-CM

## 2023-08-31 ENCOUNTER — Other Ambulatory Visit (INDEPENDENT_AMBULATORY_CARE_PROVIDER_SITE_OTHER): Payer: Self-pay | Admitting: Pediatrics

## 2023-09-02 ENCOUNTER — Other Ambulatory Visit (INDEPENDENT_AMBULATORY_CARE_PROVIDER_SITE_OTHER): Payer: Self-pay | Admitting: Pediatrics

## 2023-09-10 ENCOUNTER — Ambulatory Visit
Admission: RE | Admit: 2023-09-10 | Discharge: 2023-09-10 | Disposition: A | Discharge status: Home / Self Care | Source: Ambulatory Visit | Attending: Orthopedic Surgery | Admitting: Orthopedic Surgery

## 2023-09-10 DIAGNOSIS — M4316 Spondylolisthesis, lumbar region: Secondary | ICD-10-CM
